# Patient Record
Sex: Female | Born: 1990 | Race: Black or African American | Hispanic: No | Marital: Single | Smoking: Never smoker
Health system: Southern US, Community
[De-identification: ages and names within clinical notes are randomized; demographics above are authoritative.]

## PROBLEM LIST (undated history)

## (undated) ENCOUNTER — Inpatient Hospital Stay (HOSPITAL_COMMUNITY): Payer: Self-pay

## (undated) DIAGNOSIS — K219 Gastro-esophageal reflux disease without esophagitis: Secondary | ICD-10-CM

## (undated) DIAGNOSIS — E739 Lactose intolerance, unspecified: Secondary | ICD-10-CM

## (undated) DIAGNOSIS — R519 Headache, unspecified: Secondary | ICD-10-CM

## (undated) DIAGNOSIS — N83209 Unspecified ovarian cyst, unspecified side: Secondary | ICD-10-CM

## (undated) HISTORY — PX: INDUCED ABORTION: SHX677

---

## 2013-08-24 ENCOUNTER — Encounter (HOSPITAL_COMMUNITY): Payer: Self-pay | Admitting: Emergency Medicine

## 2013-08-24 ENCOUNTER — Emergency Department (HOSPITAL_COMMUNITY)
Admission: EM | Admit: 2013-08-24 | Discharge: 2013-08-24 | Disposition: A | Discharge status: Home / Self Care | Payer: Medicaid Other | Attending: Emergency Medicine | Admitting: Emergency Medicine

## 2013-08-24 DIAGNOSIS — N949 Unspecified condition associated with female genital organs and menstrual cycle: Secondary | ICD-10-CM | POA: Diagnosis not present

## 2013-08-24 DIAGNOSIS — Z3202 Encounter for pregnancy test, result negative: Secondary | ICD-10-CM | POA: Insufficient documentation

## 2013-08-24 DIAGNOSIS — N938 Other specified abnormal uterine and vaginal bleeding: Secondary | ICD-10-CM | POA: Insufficient documentation

## 2013-08-24 DIAGNOSIS — IMO0002 Reserved for concepts with insufficient information to code with codable children: Secondary | ICD-10-CM | POA: Diagnosis not present

## 2013-08-24 DIAGNOSIS — N939 Abnormal uterine and vaginal bleeding, unspecified: Secondary | ICD-10-CM

## 2013-08-24 LAB — I-STAT CHEM 8, ED
BUN: 7 mg/dL (ref 6–23)
CHLORIDE: 103 meq/L (ref 96–112)
Calcium, Ion: 1.2 mmol/L (ref 1.12–1.23)
Creatinine, Ser: 1 mg/dL (ref 0.50–1.10)
GLUCOSE: 80 mg/dL (ref 70–99)
HCT: 42 % (ref 36.0–46.0)
Hemoglobin: 14.3 g/dL (ref 12.0–15.0)
POTASSIUM: 3.8 meq/L (ref 3.7–5.3)
SODIUM: 139 meq/L (ref 137–147)
TCO2: 25 mmol/L (ref 0–100)

## 2013-08-24 LAB — URINALYSIS, ROUTINE W REFLEX MICROSCOPIC
BILIRUBIN URINE: NEGATIVE
Glucose, UA: NEGATIVE mg/dL
HGB URINE DIPSTICK: NEGATIVE
KETONES UR: NEGATIVE mg/dL
Leukocytes, UA: NEGATIVE
Nitrite: NEGATIVE
PH: 5.5 (ref 5.0–8.0)
Protein, ur: NEGATIVE mg/dL
SPECIFIC GRAVITY, URINE: 1.023 (ref 1.005–1.030)
Urobilinogen, UA: 0.2 mg/dL (ref 0.0–1.0)

## 2013-08-24 LAB — WET PREP, GENITAL
CLUE CELLS WET PREP: NONE SEEN
TRICH WET PREP: NONE SEEN
WBC WET PREP: NONE SEEN
Yeast Wet Prep HPF POC: NONE SEEN

## 2013-08-24 LAB — PREGNANCY, URINE: Preg Test, Ur: NEGATIVE

## 2013-08-24 NOTE — ED Notes (Signed)
Pt placed in gown and in bed. Pt monitored by bp cuff, 12-lead, and pulse ox. 

## 2013-08-24 NOTE — Discharge Instructions (Signed)
°Emergency Department Resource Guide °1) Find a Doctor and Pay Out of Pocket °Although you won't have to find out who is covered by your insurance plan, it is a good idea to ask around and get recommendations. You will then need to call the office and see if the doctor you have chosen will accept you as a new patient and what types of options they offer for patients who are self-pay. Some doctors offer discounts or will set up payment plans for their patients who do not have insurance, but you will need to ask so you aren't surprised when you get to your appointment. ° °2) Contact Your Local Health Department °Not all health departments have doctors that can see patients for sick visits, but many do, so it is worth a call to see if yours does. If you don't know where your local health department is, you can check in your phone book. The CDC also has a tool to help you locate your state's health department, and many state websites also have listings of all of their local health departments. ° °3) Find a Walk-in Clinic °If your illness is not likely to be very severe or complicated, you may want to try a walk in clinic. These are popping up all over the country in pharmacies, drugstores, and shopping centers. They're usually staffed by nurse practitioners or physician assistants that have been trained to treat common illnesses and complaints. They're usually fairly quick and inexpensive. However, if you have serious medical issues or chronic medical problems, these are probably not your best option. ° °No Primary Care Doctor: °- Call [facility] at  832-8000 - they can help you locate a primary care doctor that  accepts your insurance, provides certain services, etc. °- Physician Referral Service- 1-800-533-3463 ° °Chronic Pain Problems: °Organization         Address  Phone   Notes  °[facility]  (336) 297-2271 Patients need to be referred by their primary care doctor.  ° °Medication  Assistance: °Organization         Address  Phone   Notes  °[place] [facility] [address] °[place], [place] [address] (336) 641-8030 --Must be a resident of [place] °-- Must have NO insurance coverage whatsoever (no Medicaid/ Medicare, etc.) °-- The pt. MUST have a primary care doctor that directs their care regularly and follows them in the community °  °[facility]  (866) 331-1348   °[employer]  (888) 892-1162   ° °Agencies that provide inexpensive medical care: °Organization         Address  Phone   Notes  °[facility]  (336) 832-8035   °[facility]    (336) 832-7272   °[facility] [address] °[place], [place] [address] (336) 832-4777   °[facility] [address], °[place] (336) 271-4999   °[facility]    (336) 373-0678   °[facility]    (336) 272-1050   °[facility] ° [address], [place] Phone:  (336) 832-4444, Fax:  (336) 832-4440 Hours of Operation:  9 am - 6 pm, M-F.  Also accepts Medicaid/Medicare and self-pay.  °[facility] ° [address], [place] Phone: (336) 832-3150, Fax: (336) [phone]. Hours of Operation:  8:30 am - 5:30 pm, M-F.  Also accepts Medicaid and self-pay.  °[facility] [place] [address], [place] Phone: (336) 878-6027   °[facility] [address], [place], [place] (336)723-1848, Ext. 123 Mondays & Thursdays: 7-9 AM.  First 15 patients are seen on a first come, first serve basis. °  ° °Medicaid-accepting [place] Providers: ° °Organization         Address  Phone   Notes  °[facility] [address], [place] (336) 641-2100 Also accepts self-pay patients.  °[facility] [address], [place] ° (336) 856-9996   °[facility] [address], [place]  (336) 288-8857   °[facility] [address] [place] [address], [place] (336) 299-7000   °Veita Bland [address], [place]  ° (336) 373-1557 Only accepts [place] Access Medicaid patients after they have their name applied to their card.  ° °Self-Pay (no insurance) in [place]: ° °Organization         Address  Phone   Notes  °Sickle Cell Patients, [facility] [address], [place] (336) 832-1970   °[facility] [address], [place] (336) 832-4400   °[facility] ° [address], [place] (336) 992-4800   °[facility]/Dr. Osei-Bonsu ° [address] [place] [address], [place] or [address], [place] (336) 841-8500 Phone number for both [place] and [place] locations is the same.  °[facility] [address], [place] (336) 299-0000   °[facility] [address] [place] [address], [place] or [address] (336) 852-7530 °(336) 878-2260   °[facility] [address], [place] (336) 350-1642, phone; (336) 294-5005, fax Sees patients 1st and 3rd Saturday of every month.  Must not qualify for public or private insurance (i.e. Medicaid, Medicare, [place] Health Choice, Veterans' Benefits) • Household income should be no more than 200% of the poverty level •The clinic cannot treat you if you are pregnant or think you are pregnant • Sexually transmitted diseases are not treated at the clinic.  ° ° °Dental Care: °Organization         Address  Phone  Notes  °[place] [facility] [address], [place] (336) 641-6152 Accepts children up to age 21 who are enrolled in Medicaid or [place] Health Choice; pregnant women with a Medicaid card; and children who have applied for Medicaid or [place] Health Choice, but were declined, whose parents can pay a reduced fee at time of service.  °[place]  [facility] [place]  [address], [place] (336) 641-7733 Accepts children up to age 21 who are enrolled in Medicaid or [place] Health Choice; pregnant women with a Medicaid card; and children who have applied for Medicaid or [place] Health Choice, but were declined, whose parents can pay a reduced fee at time of service.  °[facility] ° [address], [place] (336) 641-4533 Patients are seen by appointment only. Walk-ins are not accepted. [facility] will see patients 18 years of age and older. °Monday - Tuesday (8am-5pm) °Most Wednesdays (8:30-5pm) °$30 per visit, cash only  °[facility] ° [address], [place] (336) 641-4533 Patients are seen by appointment only. Walk-ins are not accepted. [facility] will see patients 18 years of age and older. °One   Wednesday Evening (Monthly: Volunteer Based).  $30 per visit, cash only  °[facility]  (919) 537-3737 for adults; Children under age 4, call Graduate Pediatric Dentistry at ([phone]) [phone]. Children aged 4-14, please call (919) [phone] to request a pediatric application. ° Dental services are provided in all areas of dental care including fillings, crowns and bridges, complete and partial dentures, implants, gum treatment, root canals, and extractions. Preventive care is also provided. Treatment is provided to both adults and children. °Patients are selected via a lottery and there is often a waiting list. °  °[facility] [address], °[place] ° (336) 763-8833 [email or web address] °  °[facility] [address], [place], [place] (336)723-1848, Ext. 123 Second and Fourth Thursday of each month, opens at 6:30 AM; Clinic ends at 9 AM.  Patients are seen on a first-come first-served basis, and a limited number are seen during each clinic.  ° °[facility] ° [address], [place], [place] (336) 723-7904    Eligibility Requirements °You must have lived in [place], [place], or [place] counties for at least the last three months. °  You cannot be eligible for state or federal sponsored healthcare insurance, including Veterans Administration, Medicaid, or Medicare. °  You generally cannot be eligible for healthcare insurance through your employer.  °  How to apply: °Eligibility screenings are held every Tuesday and Wednesday afternoon from 1:00 pm until 4:00 pm. You do not need an appointment for the interview!  °[facility] [address], [place], [place] 336-631-2330   °[facility]  336-342-8273   °[facility]  336-703-3100   °[facility]  336-570-6415   ° °Behavioral Health Resources in the Community: °Intensive Outpatient Programs °Organization         Address  Phone  Notes  °[facility] [address], [place], [place] 336-878-6098   °[facility] [address], [place], [place] 336-832-9800   °[facility] [address], [place], [place] ° 336-882-2125   °[facility] [address],  °[place], [place] 1-800-853-5163 or 336-641-4981   °Substance Abuse Resources °Organization         Address  Phone  Notes  °[facility]  336-882-2125   °[facility]  336-784-9470   °[facility]  336-285-9073   °[facility]  336-845-3988   °Residential & Outpatient Substance Abuse Program  1-800-659-3381   °Psychological Services °Organization         Address  Phone  Notes  °[facility]  336- 832-9600   °[facility]  336- 378-7881   °[facility] [address], [place] 1-800-853-5163 or 336-641-4981   ° °Mobile Crisis Teams °Organization         Address  Phone  Notes  °[facility], Mobile Crisis Care Unit  1-877-626-1772   °Assertive °[facility] ° [address]  [place], [place] 336-834-9664   °Sharon DeEsch [address] °[place] 336-554-5454   ° °Self-Help/Support Groups °Organization         Address  Phone             Notes  °[facility]  - variety of support groups  336- 373-1402 Call for more information  °Narcotics Anonymous (NA), [facility] [address], °[place]  2 meetings at this location  ° °  Residential Treatment Programs Organization         Address  Phone  Notes  [facility] [address],    [place]  [phone]-(213)[phone]   [facility]  [address], [place] [phone], [place], [place] [phone]   [facility] [address], [place] [phone] Admissions: 8am-3pm M-F  [facility] [address]    [place], [place] [phone]   [facility] [address], [place], [place] [phone]   [facility] [address]  [place], [place] [phone]   [facility] Intensive Outpatient [address], [place], [place] [phone]   [facility] ([facility] [address]  [place], [place] [phone] or [phone]   [facility] ([facility]) [address] [place], [place] [phone] Accepts Medicaid  [facility] [address]  [place] [phone] Substance Abuse/Addiction Treatment   [facility]         Address  Phone  Notes  [facility]  6132867623(888) ([phone]   Angie FavaJulie Brannon, PhD [address], [address] [place], [place]   ([phone](336) [phone] or [phone](336) [phone]   [facility]   [address] [place], [place] ([phone](336) [phone]   [facility] [address], [place], [place] [phone](336) [phone] Insurance/Medicaid/sponsorship through [facility]  [facility] [address]                                    [place], [place] [phone]) [phone] Therapy/tele-psych/case    [facility] [address]   [place], [place] [phone]) [phone]    Dr. Lolly MustacheArfeen  [phone](336) [phone]   [facility]  [employer] [facility] 1) [address], [place] 2) [address], [place] 3)  [address], [place] [phone](336) ([phone](336) [phone]  ([phone](336) [phone]   [facility] 5596800938(336) (516)483-9396 or [phone](336) [phone] (After Hours)       Take your usual prescriptions as previously directed.  Call your regular OB/GYN doctor tomorrow to schedule a follow up appointment within the next 2 days.  Return to the Emergency Department immediately sooner if worsening.

## 2013-08-24 NOTE — ED Provider Notes (Signed)
CSN: [id number]     Arrival date & time 08/24/13  1343 History   First MD Initiated Contact with Patient 08/24/13 1630     Chief Complaint  Patient presents with  . Vaginal Bleeding      HPI Pt was seen at 1655. Per pt, c/o gradual onset and persistence of waxing and waning vaginal bleeding for the past 3 weeks. Pt was evaluated by her OB/GYN Dr. Karleen HampshireSpencer for same, rx OCP on 08/10/13. Pt states she "took one" then "stopped because it make the blood turn from red to dark." Pt states she continues to have intermittent dark vaginal bleeding "with clots." States she has not called back her OB/GYN MD. Denies back pain, no N/V/D, no fevers, no vaginal discharge.    History reviewed. No pertinent past medical history.  History reviewed. No pertinent past surgical history.  History  Substance Use Topics  . Smoking status: Never Smoker   . Smokeless tobacco: Not on file  . Alcohol Use: No    Review of Systems ROS: Statement: All systems negative except as marked or noted in the HPI; Constitutional: Negative for fever and chills. ; ; Eyes: Negative for eye pain, redness and discharge. ; ; ENMT: Negative for ear pain, hoarseness, nasal congestion, sinus pressure and sore throat. ; ; Cardiovascular: Negative for chest pain, palpitations, diaphoresis, dyspnea and peripheral edema. ; ; Respiratory: Negative for cough, wheezing and stridor. ; ; Gastrointestinal: Negative for nausea, vomiting, diarrhea, abdominal pain, blood in stool, hematemesis, jaundice and rectal bleeding. . ; ; Genitourinary: Negative for dysuria, flank pain and hematuria. ; ; GYN:  +vaginal bleeding, no vaginal discharge, no vulvar pain. ;; Musculoskeletal: Negative for back pain and neck pain. Negative for swelling and trauma.; ; Skin: Negative for pruritus, rash, abrasions, blisters, bruising and skin lesion.; ; Neuro: Negative for headache, lightheadedness and neck stiffness. Negative for weakness, altered level of consciousness ,  altered mental status, extremity weakness, paresthesias, involuntary movement, seizure and syncope.      Allergies  Review of patient's allergies indicates no known allergies.  Home Medications   Prior to Admission medications   Medication Sig Start Date End Date Taking? Authorizing Provider  norgestimate-ethinyl estradiol (MONONESSA) 0.25-35 MG-MCG tablet Take 1 tablet by mouth daily.   Yes Historical Provider, MD   BP 115/66  Pulse 53  Temp(Src) 97.9 F (36.6 C) (Oral)  Resp 15  Ht 5\' 2"  (1.575 m)  Wt 177 lb (80.287 kg)  BMI 32.37 kg/m2  SpO2 100%  LMP 08/24/2013 Physical Exam 1700: Physical examination:  Nursing notes reviewed; Vital signs and O2 SAT reviewed;  Constitutional: Well developed, Well nourished, Well hydrated, In no acute distress; Head:  Normocephalic, atraumatic; Eyes: EOMI, PERRL, No scleral icterus; ENMT: Mouth and pharynx normal, Mucous membranes moist; Neck: Supple, Full range of motion, No lymphadenopathy; Cardiovascular: Regular rate and rhythm, No murmur, rub, or gallop; Respiratory: Breath sounds clear & equal bilaterally, No rales, rhonchi, wheezes.  Speaking full sentences with ease, Normal respiratory effort/excursion; Chest: Nontender, Movement normal; Abdomen: Soft, Nontender, Nondistended, Normal bowel sounds; Genitourinary: No CVA tenderness. Pelvic exam performed with permission of pt and female ED tech assist during exam.  External genitalia w/o lesions. Vaginal vault without discharge, +dark blood in vaginal vault.  Cervix w/o lesions, not friable, GC/chlam and wet prep obtained and sent to lab.  Bimanual exam w/o CMT, uterine or adnexal tenderness.;; Extremities: Pulses normal, No tenderness, No edema, No calf edema or asymmetry.; Neuro: AA&Ox3, Major CN grossly  intact.  Speech clear. No gross focal motor or sensory deficits in extremities. Climbs on and off stretcher easily by herself. Gait steady.; Skin: Color normal, Warm, Dry.   ED Course   Procedures     MDM  MDM Reviewed: nursing note and vitals Interpretation: labs    Results for orders placed during the hospital encounter of 08/24/13  WET PREP, GENITAL      Result Value Ref Range   Yeast Wet Prep HPF POC NONE SEEN  NONE SEEN   Trich, Wet Prep NONE SEEN  NONE SEEN   Clue Cells Wet Prep HPF POC NONE SEEN  NONE SEEN   WBC, Wet Prep HPF POC NONE SEEN  NONE SEEN  URINALYSIS, ROUTINE W REFLEX MICROSCOPIC      Result Value Ref Range   Color, Urine YELLOW  YELLOW   APPearance CLEAR  CLEAR   Specific Gravity, Urine 1.023  1.005 - 1.030   pH 5.5  5.0 - 8.0   Glucose, UA NEGATIVE  NEGATIVE mg/dL   Hgb urine dipstick NEGATIVE  NEGATIVE   Bilirubin Urine NEGATIVE  NEGATIVE   Ketones, ur NEGATIVE  NEGATIVE mg/dL   Protein, ur NEGATIVE  NEGATIVE mg/dL   Urobilinogen, UA 0.2  0.0 - 1.0 mg/dL   Nitrite NEGATIVE  NEGATIVE   Leukocytes, UA NEGATIVE  NEGATIVE  PREGNANCY, URINE      Result Value Ref Range   Preg Test, Ur NEGATIVE  NEGATIVE  I-STAT CHEM 8, ED      Result Value Ref Range   Sodium 139  137 - 147 mEq/L   Potassium 3.8  3.7 - 5.3 mEq/L   Chloride 103  96 - 112 mEq/L   BUN 7  6 - 23 mg/dL   Creatinine, Ser 9.60  0.50 - 1.10 mg/dL   Glucose, Bld 80  70 - 99 mg/dL   Calcium, Ion 4.54  1.12 - 1.23 mmol/L   TCO2 25  0 - 100 mmol/L   Hemoglobin 14.3  12.0 - 15.0 g/dL   HCT 09.8  11.9 - 14.7 %    1855:  VS, H/H stable. Workup reassuring. Pt encouraged to take the medications as rx by her OB/GYN MD; verb understanding. Dx and testing d/w pt.  Questions answered.  Verb understanding, agreeable to d/c home with outpt f/u.     Laray Anger, DO 08/25/13 1627

## 2013-08-24 NOTE — ED Notes (Addendum)
Pt here for irregular periods and passing clots. Soaking pad every 4 hours. Pt was checked for serum preg level on the 2nd and was not pregnant. Pt has GI MD appt tomm for reported pain and H pylori.

## 2013-08-25 LAB — GC/CHLAMYDIA PROBE AMP
CT Probe RNA: INVALID
GC PROBE AMP APTIMA: INVALID

## 2014-05-19 DIAGNOSIS — R768 Other specified abnormal immunological findings in serum: Secondary | ICD-10-CM | POA: Insufficient documentation

## 2014-06-01 ENCOUNTER — Other Ambulatory Visit: Payer: Self-pay | Admitting: Physician Assistant

## 2014-06-01 DIAGNOSIS — R1084 Generalized abdominal pain: Secondary | ICD-10-CM

## 2014-06-07 ENCOUNTER — Other Ambulatory Visit: Payer: Medicaid Other

## 2014-06-08 ENCOUNTER — Ambulatory Visit
Admission: RE | Admit: 2014-06-08 | Discharge: 2014-06-08 | Disposition: A | Discharge status: Home / Self Care | Payer: Medicaid Other | Source: Ambulatory Visit | Attending: Physician Assistant | Admitting: Physician Assistant

## 2014-06-08 DIAGNOSIS — R1084 Generalized abdominal pain: Secondary | ICD-10-CM

## 2015-06-13 ENCOUNTER — Encounter (HOSPITAL_COMMUNITY): Payer: Self-pay | Admitting: Emergency Medicine

## 2015-06-13 ENCOUNTER — Emergency Department (HOSPITAL_COMMUNITY)
Admission: EM | Admit: 2015-06-13 | Discharge: 2015-06-13 | Disposition: A | Discharge status: Home / Self Care | Payer: Medicaid Other | Attending: Emergency Medicine | Admitting: Emergency Medicine

## 2015-06-13 DIAGNOSIS — K219 Gastro-esophageal reflux disease without esophagitis: Secondary | ICD-10-CM

## 2015-06-13 DIAGNOSIS — Z79899 Other long term (current) drug therapy: Secondary | ICD-10-CM | POA: Insufficient documentation

## 2015-06-13 LAB — COMPREHENSIVE METABOLIC PANEL
ALT: 25 U/L (ref 14–54)
AST: 34 U/L (ref 15–41)
Albumin: 4.4 g/dL (ref 3.5–5.0)
Alkaline Phosphatase: 47 U/L (ref 38–126)
Anion gap: 10 (ref 5–15)
BILIRUBIN TOTAL: 0.6 mg/dL (ref 0.3–1.2)
BUN: 10 mg/dL (ref 6–20)
CO2: 25 mmol/L (ref 22–32)
CREATININE: 0.8 mg/dL (ref 0.44–1.00)
Calcium: 9.6 mg/dL (ref 8.9–10.3)
Chloride: 104 mmol/L (ref 101–111)
Glucose, Bld: 86 mg/dL (ref 65–99)
POTASSIUM: 3.7 mmol/L (ref 3.5–5.1)
Sodium: 139 mmol/L (ref 135–145)
TOTAL PROTEIN: 8.3 g/dL — AB (ref 6.5–8.1)

## 2015-06-13 LAB — URINALYSIS, ROUTINE W REFLEX MICROSCOPIC
Bilirubin Urine: NEGATIVE
Glucose, UA: NEGATIVE mg/dL
Hgb urine dipstick: NEGATIVE
Ketones, ur: NEGATIVE mg/dL
LEUKOCYTES UA: NEGATIVE
NITRITE: NEGATIVE
PROTEIN: NEGATIVE mg/dL
Specific Gravity, Urine: 1.02 (ref 1.005–1.030)
pH: 5.5 (ref 5.0–8.0)

## 2015-06-13 LAB — CBC
HEMATOCRIT: 40.7 % (ref 36.0–46.0)
Hemoglobin: 13.9 g/dL (ref 12.0–15.0)
MCH: 28.7 pg (ref 26.0–34.0)
MCHC: 34.2 g/dL (ref 30.0–36.0)
MCV: 84.1 fL (ref 78.0–100.0)
PLATELETS: 222 10*3/uL (ref 150–400)
RBC: 4.84 MIL/uL (ref 3.87–5.11)
RDW: 13 % (ref 11.5–15.5)
WBC: 5.5 10*3/uL (ref 4.0–10.5)

## 2015-06-13 LAB — LIPASE, BLOOD: Lipase: 23 U/L (ref 11–51)

## 2015-06-13 LAB — I-STAT BETA HCG BLOOD, ED (MC, WL, AP ONLY)

## 2015-06-13 MED ORDER — SUCRALFATE 1 G PO TABS
1.0000 g | ORAL_TABLET | Freq: Once | ORAL | Status: AC
  Administered 2015-06-13: 1 g via ORAL
  Filled 2015-06-13: qty 1

## 2015-06-13 MED ORDER — SUCRALFATE 1 G PO TABS: 1.0000 g | ORAL_TABLET | Freq: Three times a day (TID) | ORAL | Status: DC

## 2015-06-13 MED ORDER — GI COCKTAIL ~~LOC~~
30.0000 mL | Freq: Once | ORAL | Status: AC
  Administered 2015-06-13: 30 mL via ORAL
  Filled 2015-06-13: qty 30

## 2015-06-13 MED ORDER — FAMOTIDINE 20 MG PO TABS: 20.0000 mg | ORAL_TABLET | Freq: Two times a day (BID) | ORAL | Status: DC

## 2015-06-13 NOTE — Discharge Instructions (Signed)
Gastroesophageal Reflux Disease, Adult Normally, food travels down the esophagus and stays in the stomach to be digested. However, when a Teresa Bryant has gastroesophageal reflux disease (GERD), food and stomach acid move back up into the esophagus. When this happens, the esophagus becomes sore and inflamed. Over time, GERD can create small holes (ulcers) in the lining of the esophagus.  CAUSES This condition is caused by a problem with the muscle between the esophagus and the stomach (lower esophageal sphincter, or LES). Normally, the LES muscle closes after food passes through the esophagus to the stomach. When the LES is weakened or abnormal, it does not close properly, and that allows food and stomach acid to go back up into the esophagus. The LES can be weakened by certain dietary substances, medicines, and medical conditions, including:  Tobacco use.  Pregnancy.  Having a hiatal hernia.  Heavy alcohol use.  Certain foods and beverages, such as coffee, chocolate, onions, and peppermint. RISK FACTORS This condition is more likely to develop in:  People who have an increased body weight.  People who have connective tissue disorders.  People who use NSAID medicines. SYMPTOMS Symptoms of this condition include:  Heartburn.  Difficult or painful swallowing.  The feeling of having a lump in the throat.  Abitter taste in the mouth.  Bad breath.  Having a large amount of saliva.  Having an upset or bloated stomach.  Belching.  Chest pain.  Shortness of breath or wheezing.  Ongoing (chronic) cough or a night-time cough.  Wearing away of tooth enamel.  Weight loss. Different conditions can cause chest pain. Make sure to see your health care provider if you experience chest pain. DIAGNOSIS Your health care provider will take a medical history and perform a physical exam. To determine if you have mild or severe GERD, your health care provider may also monitor how you respond  to treatment. You may also have other tests, including:  An endoscopy toexamine your stomach and esophagus with a small camera.  A test thatmeasures the acidity level in your esophagus.  A test thatmeasures how much pressure is on your esophagus.  A barium swallow or modified barium swallow to show the shape, size, and functioning of your esophagus. TREATMENT The goal of treatment is to help relieve your symptoms and to prevent complications. Treatment for this condition may vary depending on how severe your symptoms are. Your health care provider may recommend:  Changes to your diet.  Medicine.  Surgery. HOME CARE INSTRUCTIONS Diet  Follow a diet as recommended by your health care provider. This may involve avoiding foods and drinks such as:  Coffee and tea (with or without caffeine).  Drinks that containalcohol.  Energy drinks and sports drinks.  Carbonated drinks or sodas.  Chocolate and cocoa.  Peppermint and mint flavorings.  Garlic and onions.  Horseradish.  Spicy and acidic foods, including peppers, chili powder, curry powder, vinegar, hot sauces, and barbecue sauce.  Citrus fruit juices and citrus fruits, such as oranges, lemons, and limes.  Tomato-based foods, such as red sauce, chili, salsa, and pizza with red sauce.  Fried and fatty foods, such as donuts, french fries, potato chips, and high-fat dressings.  High-fat meats, such as hot dogs and fatty cuts of red and white meats, such as rib eye steak, sausage, ham, and bacon.  High-fat dairy items, such as whole milk, butter, and cream cheese.  Eat small, frequent meals instead of large meals.  Avoid drinking large amounts of liquid with your   meals.  Avoid eating meals during the 2-3 hours before bedtime.  Avoid lying down right after you eat.  Do not exercise right after you eat. General Instructions  Pay attention to any changes in your symptoms.  Take over-the-counter and prescription  medicines only as told by your health care provider. Do not take aspirin, ibuprofen, or other NSAIDs unless your health care provider told you to do so.  Do not use any tobacco products, including cigarettes, chewing tobacco, and e-cigarettes. If you need help quitting, ask your health care provider.  Wear loose-fitting clothing. Do not wear anything tight around your waist that causes pressure on your abdomen.  Raise (elevate) the head of your bed 6 inches (15cm).  Try to reduce your stress, such as with yoga or meditation. If you need help reducing stress, ask your health care provider.  If you are overweight, reduce your weight to an amount that is healthy for you. Ask your health care provider for guidance about a safe weight loss goal.  Keep all follow-up visits as told by your health care provider. This is important. SEEK MEDICAL CARE IF:  You have new symptoms.  You have unexplained weight loss.  You have difficulty swallowing, or it hurts to swallow.  You have wheezing or a persistent cough.  Your symptoms do not improve with treatment.  You have a hoarse voice. SEEK IMMEDIATE MEDICAL CARE IF:  You have pain in your arms, neck, jaw, teeth, or back.  You feel sweaty, dizzy, or light-headed.  You have chest pain or shortness of breath.  You vomit and your vomit looks like blood or coffee grounds.  You faint.  Your stool is bloody or black.  You cannot swallow, drink, or eat.   This information is not intended to replace advice given to you by your health care provider. Make sure you discuss any questions you have with your health care provider.   Document Released: 11/06/2004 Document Revised: 10/18/2014 Document Reviewed: 05/24/2014 Elsevier Interactive Patient Education 2016 [employer]  

## 2015-06-13 NOTE — ED Provider Notes (Signed)
CSN: [id number]     Arrival date & time 06/13/15  1038 History   First MD Initiated Contact with Patient 06/13/15 1134     Chief Complaint  Patient presents with  . Abdominal Pain     (Consider location/radiation/quality/duration/timing/severity/associated sxs/prior Treatment) HPI Comments: Similar sx last year and seen by GI and had an egd which was neg for pud No vaginal bleeding or d/c  Patient is a 25 y.o. female presenting with abdominal pain. The history is provided by the patient.  Abdominal Pain Pain location:  Generalized Pain quality: burning   Pain radiates to:  Does not radiate Pain severity:  No pain Onset quality:  Sudden Duration:  1 week Timing:  Intermittent Progression:  Waxing and waning Chronicity:  Recurrent Worsened by:  Nothing tried Ineffective treatments:  None tried Associated symptoms: no diarrhea, no fever, no nausea and no vomiting     History reviewed. No pertinent past medical history. History reviewed. No pertinent past surgical history. No family history on file. Social History  Substance Use Topics  . Smoking status: Never Smoker   . Smokeless tobacco: None  . Alcohol Use: No   OB History    No data available     Review of Systems  Constitutional: Negative for fever.  Gastrointestinal: Positive for abdominal pain. Negative for nausea, vomiting and diarrhea.  All other systems reviewed and are negative.     Allergies  Review of patient's allergies indicates no known allergies.  Home Medications   Prior to Admission medications   Medication Sig Start Date End Date Taking? Authorizing Provider  acetaminophen (TYLENOL) 500 MG tablet Take 500 mg by mouth every 6 (six) hours as needed for moderate pain or headache.   Yes Historical Provider, MD  omeprazole (PRILOSEC) 20 MG capsule Take 20 mg by mouth daily as needed (acid reflux).  12/13/12  Yes Historical Provider, MD  valACYclovir (VALTREX) 1000 MG tablet Take 500 mg by mouth 2  (two) times daily as needed (out breaks). For 3 days 05/19/14  Yes Historical Provider, MD   BP 114/67 mmHg  Pulse 59  Temp(Src) 97.8 F (36.6 C) (Oral)  Resp 18  Ht 5\' 2"  (1.575 m)  Wt 74.844 kg  BMI 30.17 kg/m2  SpO2 99% Physical Exam  Constitutional: She is oriented to [place], place, and time. She appears well-developed and well-nourished.  Non-toxic appearance. No distress.  HENT:  Head: Normocephalic and atraumatic.  Eyes: Conjunctivae, EOM and lids are normal. Pupils are equal, round, and reactive to light.  Neck: Normal range of motion. Neck supple. No tracheal deviation present. No thyroid mass present.  Cardiovascular: Normal rate, regular rhythm and normal heart sounds.  Exam reveals no gallop.   No murmur heard. Pulmonary/Chest: Effort normal and breath sounds normal. No stridor. No respiratory distress. She has no decreased breath sounds. She has no wheezes. She has no rhonchi. She has no rales.  Abdominal: Soft. Normal appearance and bowel sounds are normal. She exhibits no distension. There is no tenderness. There is no rebound and no CVA tenderness.  Musculoskeletal: Normal range of motion. She exhibits no edema or tenderness.  Neurological: She is alert and oriented to [place], place, and time. She has normal strength. No cranial nerve deficit or sensory deficit. GCS eye subscore is 4. GCS verbal subscore is 5. GCS motor subscore is 6.  Skin: Skin is warm and dry. No abrasion and no rash noted.  Psychiatric: She has a normal mood and affect. Her speech  is normal and behavior is normal.  Nursing note and vitals reviewed.   ED Course  Procedures (including critical care time) Labs Review Labs Reviewed  LIPASE, BLOOD  COMPREHENSIVE METABOLIC PANEL  CBC  URINALYSIS, ROUTINE W REFLEX MICROSCOPIC (NOT AT [facility])  I-STAT BETA HCG BLOOD, ED ([facility], [facility], [facility] ONLY)    Imaging Review No results found. I have personally reviewed and evaluated these images and lab results as part of  my medical decision-making.   EKG Interpretation None      MDM   Final diagnoses:  None    Patient given meds for reflux and feels better. Repeat abdominal exam at time of discharge remained stable    Lorre Nick, MD 06/13/15 [phone]

## 2015-06-13 NOTE — ED Notes (Signed)
Patient here with complaints of lower abd pain x1 week. Nausea. Denies urinary symptoms.

## 2016-02-20 ENCOUNTER — Emergency Department (HOSPITAL_COMMUNITY): Payer: 59

## 2016-02-20 ENCOUNTER — Encounter (HOSPITAL_COMMUNITY): Payer: Self-pay

## 2016-02-20 ENCOUNTER — Emergency Department (HOSPITAL_COMMUNITY)
Admission: EM | Admit: 2016-02-20 | Discharge: 2016-02-20 | Disposition: A | Discharge status: Home / Self Care | Payer: 59 | Attending: Emergency Medicine | Admitting: Emergency Medicine

## 2016-02-20 DIAGNOSIS — B9689 Other specified bacterial agents as the cause of diseases classified elsewhere: Secondary | ICD-10-CM | POA: Diagnosis not present

## 2016-02-20 DIAGNOSIS — R102 Pelvic and perineal pain: Secondary | ICD-10-CM

## 2016-02-20 DIAGNOSIS — N760 Acute vaginitis: Secondary | ICD-10-CM | POA: Insufficient documentation

## 2016-02-20 DIAGNOSIS — N898 Other specified noninflammatory disorders of vagina: Secondary | ICD-10-CM | POA: Diagnosis not present

## 2016-02-20 HISTORY — DX: Lactose intolerance, unspecified: E73.9

## 2016-02-20 HISTORY — DX: Gastro-esophageal reflux disease without esophagitis: K21.9

## 2016-02-20 LAB — I-STAT BETA HCG BLOOD, ED (MC, WL, AP ONLY): I-stat hCG, quantitative: <5 m[IU]/mL (ref <5)

## 2016-02-20 LAB — WET PREP, GENITAL
Sperm: NONE SEEN
TRICH WET PREP: NONE SEEN
YEAST WET PREP: NONE SEEN

## 2016-02-20 LAB — CBC
HEMATOCRIT: 40.8 % (ref 36.0–46.0)
Hemoglobin: 13.4 g/dL (ref 12.0–15.0)
MCH: 28 pg (ref 26.0–34.0)
MCHC: 32.8 g/dL (ref 30.0–36.0)
MCV: 85.4 fL (ref 78.0–100.0)
Platelets: 242 10*3/uL (ref 150–400)
RBC: 4.78 MIL/uL (ref 3.87–5.11)
RDW: 12.9 % (ref 11.5–15.5)
WBC: 5.4 10*3/uL (ref 4.0–10.5)

## 2016-02-20 LAB — URINALYSIS, ROUTINE W REFLEX MICROSCOPIC
BILIRUBIN URINE: NEGATIVE
Bacteria, UA: NONE SEEN
Glucose, UA: NEGATIVE mg/dL
KETONES UR: NEGATIVE mg/dL
LEUKOCYTES UA: NEGATIVE
NITRITE: NEGATIVE
PH: 5 (ref 5.0–8.0)
Protein, ur: NEGATIVE mg/dL
Specific Gravity, Urine: 1.012 (ref 1.005–1.030)

## 2016-02-20 LAB — COMPREHENSIVE METABOLIC PANEL
ALT: 23 U/L (ref 14–54)
AST: 29 U/L (ref 15–41)
Albumin: 3.9 g/dL (ref 3.5–5.0)
Alkaline Phosphatase: 46 U/L (ref 38–126)
Anion gap: 6 (ref 5–15)
BUN: 9 mg/dL (ref 6–20)
CO2: 26 mmol/L (ref 22–32)
Calcium: 9.1 mg/dL (ref 8.9–10.3)
Chloride: 106 mmol/L (ref 101–111)
Creatinine, Ser: 0.87 mg/dL (ref 0.44–1.00)
Glucose, Bld: 94 mg/dL (ref 65–99)
POTASSIUM: 3.5 mmol/L (ref 3.5–5.1)
Sodium: 138 mmol/L (ref 135–145)
Total Bilirubin: 0.5 mg/dL (ref 0.3–1.2)
Total Protein: 7.4 g/dL (ref 6.5–8.1)

## 2016-02-20 LAB — LIPASE, BLOOD: Lipase: 23 U/L (ref 11–51)

## 2016-02-20 MED ORDER — AZITHROMYCIN 250 MG PO TABS
1000.0000 mg | ORAL_TABLET | Freq: Once | ORAL | Status: AC
  Administered 2016-02-20: 1000 mg via ORAL
  Filled 2016-02-20: qty 4

## 2016-02-20 MED ORDER — METRONIDAZOLE 500 MG PO TABS: 500.0000 mg | ORAL_TABLET | Freq: Two times a day (BID) | ORAL | 0 refills | Status: DC

## 2016-02-20 MED ORDER — CEFTRIAXONE SODIUM 250 MG IJ SOLR
250.0000 mg | Freq: Once | INTRAMUSCULAR | Status: AC
  Administered 2016-02-20: 250 mg via INTRAMUSCULAR
  Filled 2016-02-20: qty 250

## 2016-02-20 NOTE — ED Triage Notes (Signed)
Pt with mid low abdominal pain with vaginal discharge.  Has had similar symptoms off/on including the discharge since abortion last August.  States nausea with no vomiting.  No fever.  Discharge clear to yellow.  Blood spotting now.  LMP 12/27

## 2016-02-20 NOTE — ED Provider Notes (Signed)
[facility]-EMERGENCY DEPT Provider Note   CSN: [id number] Arrival date & time: 02/20/16  [id number]     History   Chief Complaint Chief Complaint  Patient presents with  . Abdominal Pain  . Vaginal Discharge    HPI Teresa Bryant is a 26 y.o. female.  Patient presents to the emergency department with chief complaint of right lower abdominal pain and clear to yellow vaginal discharge. She states that she has had intermittent symptoms for the past several months. She denies any fevers, chills, nausea, vomiting, or diarrhea. She denies any modifying factors. She states that her last menstrual period was 02/06/16. She has not taken anything for her symptoms. She is sexually active.   The history is provided by the patient. No language interpreter was used.    Past Medical History:  Diagnosis Date  . GERD (gastroesophageal reflux disease)   . Lactose intolerance     There are no active problems to display for this patient.   Past Surgical History:  Procedure Laterality Date  . INDUCED ABORTION      OB History    No data available       Home Medications    Prior to Admission medications   Medication Sig Start Date End Date Taking? Authorizing Provider  acetaminophen (TYLENOL) 500 MG tablet Take 500 mg by mouth every 6 (six) hours as needed for moderate pain or headache.   Yes Historical Provider, MD  Miconazole Nitrate (MONISTAT 7 VA) Place 1 application vaginally at bedtime. For 7 days   Yes Historical Provider, MD  valACYclovir (VALTREX) 1000 MG tablet Take 500 mg by mouth 2 (two) times daily as needed (out breaks). For 3 days 05/19/14  Yes Historical Provider, MD    Family History History reviewed. No pertinent family history.  Social History Social History  Substance Use Topics  . Smoking status: Never Smoker  . Smokeless tobacco: Never Used  . Alcohol use No     Allergies   Patient has no known allergies.   Review of Systems Review of Systems  All other systems  reviewed and are negative.    Physical Exam Updated Vital Signs BP 117/78   Pulse (!) 48   Temp 98.5 F (36.9 C) (Oral)   Resp 18   Ht 5\' 2"  (1.575 m)   Wt 76.2 kg   LMP 02/06/2016   SpO2 100%   BMI 30.73 kg/m   Physical Exam  Constitutional: She is oriented to [place], place, and time. She appears well-developed and well-nourished.  HENT:  Head: Normocephalic and atraumatic.  Eyes: Conjunctivae and EOM are normal. Pupils are equal, round, and reactive to light.  Neck: Normal range of motion. Neck supple.  Cardiovascular: Normal rate and regular rhythm.  Exam reveals no gallop and no friction rub.   No murmur heard. Pulmonary/Chest: Effort normal and breath sounds normal. No respiratory distress. She has no wheezes. She has no rales. She exhibits no tenderness.  Abdominal: Soft. Bowel sounds are normal. She exhibits no distension and no mass. There is no tenderness. There is no rebound and no guarding.  Genitourinary:  Genitourinary Comments: Pelvic exam chaperoned by female ER tech, mild right adnexal tenderness, no left adnexal tenderness, no uterine tenderness, mild white vaginal discharge, no bleeding, no CMT or friability, no foreign body, no injury to the external genitalia, no other significant findings   Musculoskeletal: Normal range of motion. She exhibits no edema or tenderness.  Neurological: She is alert and oriented to [place], place, and  time.  Skin: Skin is warm and dry.  Psychiatric: She has a normal mood and affect. Her behavior is normal. Judgment and thought content normal.  Nursing note and vitals reviewed.    ED Treatments / Results  Labs (all labs ordered are listed, but only abnormal results are displayed) Labs Reviewed  WET PREP, GENITAL - Abnormal; Notable for the following:       Result Value   Clue Cells Wet Prep HPF POC PRESENT (*)    WBC, Wet Prep HPF POC MANY (*)    All other components within normal limits  URINALYSIS, ROUTINE W REFLEX  MICROSCOPIC - Abnormal; Notable for the following:    Hgb urine dipstick SMALL (*)    Squamous Epithelial / LPF 0-5 (*)    All other components within normal limits  LIPASE, BLOOD  COMPREHENSIVE METABOLIC PANEL  CBC  I-STAT BETA HCG BLOOD, ED ([facility], [facility], [facility] ONLY)  GC/CHLAMYDIA PROBE AMP ([place]) NOT AT  [facility]    EKG  EKG Interpretation None       Radiology US Transvaginal Non-ob  Result Date: 02/20/2016 CLINICAL DATA:  Intermittent pelvic pain since abortion in August 2017 EXAM: TRANSABDOMINAL AND TRANSVAGINAL ULTRASOUND OF PELVIS DOPPLER ULTRASOUND OF OVARIES TECHNIQUE: Both transabdominal and transvaginal ultrasound examinations of the pelvis were performed. Transabdominal technique was performed for global imaging of the pelvis including uterus, ovaries, adnexal regions, and pelvic cul-de-sac. It was necessary to proceed with endovaginal exam following the transabdominal exam to visualize the ovaries. Color and duplex Doppler ultrasound was utilized to evaluate blood flow to the ovaries. COMPARISON:  None. FINDINGS: Uterus Measurements: 7.4 x 3.9 by 4.5 cm. No fibroids or other mass visualized. Endometrium Thickness: 9 mm thickness within normal limits. No focal abnormality visualized. Right ovary Measurements: 2.8 x 1.9 x 2.2 cm. Normal appearance/no adnexal mass. Left ovary Measurements: 2.9 x 1.9 x 2.1 cm. Normal appearance/no adnexal mass. Pulsed Doppler evaluation of both ovaries demonstrates normal low-resistance arterial and venous waveforms. Other findings No abnormal free fluid. IMPRESSION: 1. Normal pelvic ultrasound. No adnexal mass. No evidence of ovarian torsion. No pelvic free fluid. Electronically Signed   By: Natasha Mead M.D.   On: 02/20/2016 13:39   US Pelvis Complete  Result Date: 02/20/2016 CLINICAL DATA:  Intermittent pelvic pain since abortion in August 2017 EXAM: TRANSABDOMINAL AND TRANSVAGINAL ULTRASOUND OF PELVIS DOPPLER ULTRASOUND OF OVARIES TECHNIQUE: Both  transabdominal and transvaginal ultrasound examinations of the pelvis were performed. Transabdominal technique was performed for global imaging of the pelvis including uterus, ovaries, adnexal regions, and pelvic cul-de-sac. It was necessary to proceed with endovaginal exam following the transabdominal exam to visualize the ovaries. Color and duplex Doppler ultrasound was utilized to evaluate blood flow to the ovaries. COMPARISON:  None. FINDINGS: Uterus Measurements: 7.4 x 3.9 by 4.5 cm. No fibroids or other mass visualized. Endometrium Thickness: 9 mm thickness within normal limits. No focal abnormality visualized. Right ovary Measurements: 2.8 x 1.9 x 2.2 cm. Normal appearance/no adnexal mass. Left ovary Measurements: 2.9 x 1.9 x 2.1 cm. Normal appearance/no adnexal mass. Pulsed Doppler evaluation of both ovaries demonstrates normal low-resistance arterial and venous waveforms. Other findings No abnormal free fluid. IMPRESSION: 1. Normal pelvic ultrasound. No adnexal mass. No evidence of ovarian torsion. No pelvic free fluid. Electronically Signed   By: Natasha Mead M.D.   On: 02/20/2016 13:39   [place] Art/ven Flow Abd Pelv Doppler  Result Date: 02/20/2016 CLINICAL DATA:  Intermittent pelvic pain since abortion in August 2017 EXAM:  TRANSABDOMINAL AND TRANSVAGINAL ULTRASOUND OF PELVIS DOPPLER ULTRASOUND OF OVARIES TECHNIQUE: Both transabdominal and transvaginal ultrasound examinations of the pelvis were performed. Transabdominal technique was performed for global imaging of the pelvis including uterus, ovaries, adnexal regions, and pelvic cul-de-sac. It was necessary to proceed with endovaginal exam following the transabdominal exam to visualize the ovaries. Color and duplex Doppler ultrasound was utilized to evaluate blood flow to the ovaries. COMPARISON:  None. FINDINGS: Uterus Measurements: 7.4 x 3.9 by 4.5 cm. No fibroids or other mass visualized. Endometrium Thickness: 9 mm thickness within normal limits. No  focal abnormality visualized. Right ovary Measurements: 2.8 x 1.9 x 2.2 cm. Normal appearance/no adnexal mass. Left ovary Measurements: 2.9 x 1.9 x 2.1 cm. Normal appearance/no adnexal mass. Pulsed Doppler evaluation of both ovaries demonstrates normal low-resistance arterial and venous waveforms. Other findings No abnormal free fluid. IMPRESSION: 1. Normal pelvic ultrasound. No adnexal mass. No evidence of ovarian torsion. No pelvic free fluid. Electronically Signed   By: Natasha Mead M.D.   On: 02/20/2016 13:39    Procedures Procedures (including critical care time)  Medications Ordered in ED Medications  cefTRIAXone (ROCEPHIN) injection 250 mg (not administered)  azithromycin (ZITHROMAX) tablet 1,000 mg (not administered)     Initial Impression / Assessment and Plan / ED Course  I have reviewed the triage vital signs and the nursing notes.  Pertinent labs & imaging results that were available during my care of the patient were reviewed by me and considered in my medical decision making (see chart for details).  Clinical Course     Patient with vaginal discharge and mild right adnexal tenderness. Pelvic exam remarkable for mild tenderness, mild discharge. Clue cells seen on wet prep. GC pending. Will treat with Rocephin and azithromycin. Will also give Flagyl for bacterial vaginosis. Pelvic ultrasound is unremarkable. Patient is well-appearing. She is not in apparent distress. Discharged home.  Final Clinical Impressions(s) / ED Diagnoses   Final diagnoses:  Bacterial vaginosis  Vaginal discharge    New Prescriptions New Prescriptions   METRONIDAZOLE (FLAGYL) 500 MG TABLET    Take 1 tablet (500 mg total) by mouth 2 (two) times daily.        Roxy Horseman, PA-C 02/20/16 1407    Laurence Spates, MD 02/20/16 [phone]

## 2016-02-21 LAB — GC/CHLAMYDIA PROBE AMP (~~LOC~~) NOT AT ARMC
Chlamydia: NEGATIVE
NEISSERIA GONORRHEA: NEGATIVE

## 2016-06-29 DIAGNOSIS — R350 Frequency of micturition: Secondary | ICD-10-CM | POA: Diagnosis not present

## 2016-06-29 DIAGNOSIS — B373 Candidiasis of vulva and vagina: Secondary | ICD-10-CM | POA: Diagnosis not present

## 2016-07-04 DIAGNOSIS — B373 Candidiasis of vulva and vagina: Secondary | ICD-10-CM | POA: Diagnosis not present

## 2019-10-29 ENCOUNTER — Inpatient Hospital Stay (HOSPITAL_COMMUNITY): Payer: Medicaid Other

## 2019-10-29 ENCOUNTER — Encounter (HOSPITAL_COMMUNITY): Payer: Self-pay | Admitting: *Deleted

## 2019-10-29 ENCOUNTER — Other Ambulatory Visit: Payer: Self-pay

## 2019-10-29 ENCOUNTER — Inpatient Hospital Stay (HOSPITAL_COMMUNITY)
Admission: EM | Admit: 2019-10-29 | Discharge: 2019-10-29 | Disposition: A | Discharge status: Home / Self Care | Payer: Medicaid Other | Attending: Emergency Medicine | Admitting: Emergency Medicine

## 2019-10-29 DIAGNOSIS — O0001 Abdominal pregnancy with intrauterine pregnancy: Secondary | ICD-10-CM | POA: Diagnosis not present

## 2019-10-29 DIAGNOSIS — O210 Mild hyperemesis gravidarum: Secondary | ICD-10-CM | POA: Diagnosis not present

## 2019-10-29 DIAGNOSIS — O26899 Other specified pregnancy related conditions, unspecified trimester: Secondary | ICD-10-CM

## 2019-10-29 DIAGNOSIS — R109 Unspecified abdominal pain: Secondary | ICD-10-CM

## 2019-10-29 DIAGNOSIS — Z3490 Encounter for supervision of normal pregnancy, unspecified, unspecified trimester: Secondary | ICD-10-CM | POA: Diagnosis not present

## 2019-10-29 DIAGNOSIS — O26811 Pregnancy related exhaustion and fatigue, first trimester: Secondary | ICD-10-CM | POA: Insufficient documentation

## 2019-10-29 DIAGNOSIS — O219 Vomiting of pregnancy, unspecified: Secondary | ICD-10-CM

## 2019-10-29 DIAGNOSIS — O26891 Other specified pregnancy related conditions, first trimester: Secondary | ICD-10-CM | POA: Diagnosis not present

## 2019-10-29 DIAGNOSIS — Z20822 Contact with and (suspected) exposure to covid-19: Secondary | ICD-10-CM | POA: Insufficient documentation

## 2019-10-29 DIAGNOSIS — Z3201 Encounter for pregnancy test, result positive: Secondary | ICD-10-CM | POA: Diagnosis not present

## 2019-10-29 DIAGNOSIS — Z3A01 Less than 8 weeks gestation of pregnancy: Secondary | ICD-10-CM | POA: Insufficient documentation

## 2019-10-29 LAB — CBC
HCT: 40.5 % (ref 36.0–46.0)
Hemoglobin: 13.1 g/dL (ref 12.0–15.0)
MCH: 28.7 pg (ref 26.0–34.0)
MCHC: 32.3 g/dL (ref 30.0–36.0)
MCV: 88.6 fL (ref 80.0–100.0)
Platelets: 274 10*3/uL (ref 150–400)
RBC: 4.57 MIL/uL (ref 3.87–5.11)
RDW: 14 % (ref 11.5–15.5)
WBC: 9.4 10*3/uL (ref 4.0–10.5)
nRBC: 0 % (ref 0.0–0.2)

## 2019-10-29 LAB — POC URINE PREG, ED: Preg Test, Ur: POSITIVE — AB

## 2019-10-29 LAB — URINALYSIS, ROUTINE W REFLEX MICROSCOPIC
Bilirubin Urine: NEGATIVE
Glucose, UA: NEGATIVE mg/dL
Hgb urine dipstick: NEGATIVE
Ketones, ur: NEGATIVE mg/dL
Leukocytes,Ua: NEGATIVE
Nitrite: NEGATIVE
Protein, ur: NEGATIVE mg/dL
Specific Gravity, Urine: 1.013 (ref 1.005–1.030)
pH: 6 (ref 5.0–8.0)

## 2019-10-29 LAB — ABO/RH: ABO/RH(D): O POS

## 2019-10-29 LAB — SARS CORONAVIRUS 2 BY RT PCR (HOSPITAL ORDER, PERFORMED IN ~~LOC~~ HOSPITAL LAB): SARS Coronavirus 2: NEGATIVE

## 2019-10-29 LAB — HCG, QUANTITATIVE, PREGNANCY: hCG, Beta Chain, Quant, S: 129452 m[IU]/mL — ABNORMAL HIGH (ref <5)

## 2019-10-29 MED ORDER — PROMETHAZINE HCL 12.5 MG PO TABS: 12.5000 mg | ORAL_TABLET | Freq: Four times a day (QID) | ORAL | 0 refills | Status: DC | PRN

## 2019-10-29 NOTE — ED Provider Notes (Signed)
MSE was initiated and I personally evaluated the patient and placed orders (if any) at  12:40 AM on October 29, 2019.  The patient appears stable so that the remainder of the MSE may be completed by another provider.  Patient is a 29 yo female who has had at home positive pregnancy tests (estimated [redacted] weeks pregnant) who presents with fatigue, nausea, & intermittent lightheadedness with position changes over past couple of days. Denies pelvic pain, vaginal bleeding, syncope, chest pain, or dyspnea. Works with covid specimens.   On exam patient is nontoxic, vitals WNL. Heart RRR, lungs CTA.  Blood pressure 124/84, pulse 77, temperature 99.3 F (37.4 C), temperature source Oral, resp. rate 18, last menstrual period 09/09/2019, SpO2 100 %.  00:42: CONSULT: Discussed with Dr. Despina Hidden- accepts patient in transfer to [facility] for further evaluation/management.    Cherly Anderson, PA-C 10/29/19 0047    Gilda Crease, MD 10/29/19 [phone]

## 2019-10-29 NOTE — Discharge Instructions (Signed)
First Trimester of Pregnancy  The first trimester of pregnancy is from week 1 until the end of week 13 (months 1 through 3). During this time, your baby will begin to develop inside you. At 6-8 weeks, the eyes and face are formed, and the heartbeat can be seen on ultrasound. At the end of 12 weeks, all the baby's organs are formed. Prenatal care is all the medical care you receive before the birth of your baby. Make sure you get good prenatal care and follow all of your doctor's instructions. Follow these instructions at home: Medicines  Take over-the-counter and prescription medicines only as told by your doctor. Some medicines are safe and some medicines are not safe during pregnancy.  Take a prenatal vitamin that contains at least 600 micrograms (mcg) of folic acid.  If you have trouble pooping (constipation), take medicine that will make your stool soft (stool softener) if your doctor approves. Eating and drinking   Eat regular, healthy meals.  Your doctor will tell you the amount of weight gain that is right for you.  Avoid raw meat and uncooked cheese.  If you feel sick to your stomach (nauseous) or throw up (vomit): ? Eat 4 or 5 small meals a day instead of 3 large meals. ? Try eating a few soda crackers. ? Drink liquids between meals instead of during meals.  To prevent constipation: ? Eat foods that are high in fiber, like fresh fruits and vegetables, whole grains, and beans. ? Drink enough fluids to keep your pee (urine) clear or pale yellow. Activity  Exercise only as told by your doctor. Stop exercising if you have cramps or pain in your lower belly (abdomen) or low back.  Do not exercise if it is too hot, too humid, or if you are in a place of great height (high altitude).  Try to avoid standing for long periods of time. Move your legs often if you must stand in one place for a long time.  Avoid heavy lifting.  Wear low-heeled shoes. Sit and stand up  straight.  You can have sex unless your doctor tells you not to. Relieving pain and discomfort  Wear a good support bra if your breasts are sore.  Take warm water baths (sitz baths) to soothe pain or discomfort caused by hemorrhoids. Use hemorrhoid cream if your doctor says it is okay.  Rest with your legs raised if you have leg cramps or low back pain.  If you have puffy, bulging veins (varicose veins) in your legs: ? Wear support hose or compression stockings as told by your doctor. ? Raise (elevate) your feet for 15 minutes, 3-4 times a day. ? Limit salt in your food. Prenatal care  Schedule your prenatal visits by the twelfth week of pregnancy.  Write down your questions. Take them to your prenatal visits.  Keep all your prenatal visits as told by your doctor. This is important. Safety  Wear your seat belt at all times when driving.  Make a list of emergency phone numbers. The list should include numbers for family, friends, the hospital, and police and fire departments. General instructions  Ask your doctor for a referral to a local prenatal class. Begin classes no later than at the start of month 6 of your pregnancy.  Ask for help if you need counseling or if you need help with nutrition. Your doctor can give you advice or tell you where to go for help.  Do not use hot tubs, steam   rooms, or saunas.  Do not douche or use tampons or scented sanitary pads.  Do not cross your legs for long periods of time.  Avoid all herbs and alcohol. Avoid drugs that are not approved by your doctor.  Do not use any tobacco products, including cigarettes, chewing tobacco, and electronic cigarettes. If you need help quitting, ask your doctor. You may get counseling or other support to help you quit.  Avoid cat litter boxes and soil used by cats. These carry germs that can cause birth defects in the baby and can cause a loss of your baby (miscarriage) or stillbirth.  Visit your dentist.  At home, brush your teeth with a soft toothbrush. Be gentle when you floss. Contact a doctor if:  You are dizzy.  You have mild cramps or pressure in your lower belly.  You have a nagging pain in your belly area.  You continue to feel sick to your stomach, you throw up, or you have watery poop (diarrhea).  You have a bad smelling fluid coming from your vagina.  You have pain when you pee (urinate).  You have increased puffiness (swelling) in your face, hands, legs, or ankles. Get help right away if:  You have a fever.  You are leaking fluid from your vagina.  You have spotting or bleeding from your vagina.  You have very bad belly cramping or pain.  You gain or lose weight rapidly.  You throw up blood. It may look like coffee grounds.  You are around people who have German measles, fifth disease, or chickenpox.  You have a very bad headache.  You have shortness of breath.  You have any kind of trauma, such as from a fall or a car accident. Summary  The first trimester of pregnancy is from week 1 until the end of week 13 (months 1 through 3).  To take care of yourself and your unborn baby, you will need to eat healthy meals, take medicines only if your doctor tells you to do so, and do activities that are safe for you and your baby.  Keep all follow-up visits as told by your doctor. This is important as your doctor will have to ensure that your baby is healthy and growing well. This information is not intended to replace advice given to you by your health care provider. Make sure you discuss any questions you have with your health care provider. Document Revised: 05/20/2018 Document Reviewed: 02/05/2016 Elsevier Patient Education  2020 Elsevier Inc.  

## 2019-10-29 NOTE — MAU Note (Signed)
Pt sent over from ED with positive preg test.pt reports dizziness, fatigue since today. Also reports a "lite headache". Feels like she needs to be tested for COVID because she works at [employer]

## 2019-10-29 NOTE — MAU Provider Note (Signed)
History     CSN: [id number]  Arrival date and time: 10/29/19 6295   First Provider Initiated Contact with Patient 10/29/19 0232      Chief Complaint  Patient presents with  . Fatigue   HPI Teresa Bryant is a 29 y.o. [id number] at [redacted]w[redacted]d who presents to MAU from [facility] for evaluation of COVID symptoms including fatigue.  In the course of triage she was determined to have experienced sharp unilateral abdominal pain with no previous surveillance this pregnancy.  Patient works third shift and states she has been feeling more exhausted then normal. She states her daughter had to assist with her ADLs due to extreme fatigue. Her exhaustion started today.  She denies vaginal bleeding, dysuria, fever or recent illness.  OB History    Gravida  7   Para  1   Term  1   Preterm      AB  5   Living  1     SAB      TAB  5   Ectopic      Multiple      Live Births  1           Past Medical History:  Diagnosis Date  . GERD (gastroesophageal reflux disease)   . Lactose intolerance     Past Surgical History:  Procedure Laterality Date  . INDUCED ABORTION      History reviewed. No pertinent family history.  Social History   Tobacco Use  . Smoking status: Never Smoker  . Smokeless tobacco: Never Used  Substance Use Topics  . Alcohol use: No  . Drug use: No    Allergies: No Known Allergies  No medications prior to admission.    Review of Systems  Constitutional: Positive for fatigue.  Gastrointestinal: Positive for nausea.  All other systems reviewed and are negative.  Physical Exam   Blood pressure 114/66, pulse 65, temperature 98.6 F (37 C), temperature source Oral, resp. rate 18, height 5\' 2"  (1.575 m), weight 79.8 kg, last menstrual period 09/09/2019, SpO2 100 %.  Physical Exam Vitals and nursing note reviewed. Exam conducted with a chaperone present.  Constitutional:      Appearance: Normal appearance.  Cardiovascular:     Rate and Rhythm: Normal  rate.  Pulmonary:     Effort: Pulmonary effort is normal.     Breath sounds: Normal breath sounds.  Abdominal:     General: Abdomen is flat. Bowel sounds are normal.     Tenderness: There is no right CVA tenderness or left CVA tenderness.  Skin:    Capillary Refill: Capillary refill takes less than 2 seconds.  Neurological:     General: No focal deficit present.     Mental Status: She is alert.  Psychiatric:        Mood and Affect: Mood normal.     MAU Course  Procedures  Patient Vitals for the past 24 hrs:  BP Temp Temp src Pulse Resp SpO2 Height Weight  10/29/19 0330 114/66 -- -- 65 18 100 % -- --  10/29/19 0204 114/80 -- -- 70 -- -- -- --  10/29/19 0132 -- 98.6 F (37 C) Oral 62 16 100 % 5\' 2"  (1.575 m) 79.8 kg  10/29/19 0034 124/84 99.3 F (37.4 C) Oral 77 18 100 % -- --   Results for orders placed or performed during the hospital encounter of 10/29/19 (from the past 24 hour(s))  POC Urine Pregnancy, ED (not at [facility])  Status: Abnormal   Collection Time: 10/29/19 12:54 AM  Result Value Ref Range   Preg Test, Ur POSITIVE (A) NEGATIVE  Urinalysis, Routine w reflex microscopic     Status: Abnormal   Collection Time: 10/29/19  1:49 AM  Result Value Ref Range   Color, Urine YELLOW YELLOW   APPearance HAZY (A) CLEAR   Specific Gravity, Urine 1.013 1.005 - 1.030   pH 6.0 5.0 - 8.0   Glucose, UA NEGATIVE NEGATIVE mg/dL   Hgb urine dipstick NEGATIVE NEGATIVE   Bilirubin Urine NEGATIVE NEGATIVE   Ketones, ur NEGATIVE NEGATIVE mg/dL   Protein, ur NEGATIVE NEGATIVE mg/dL   Nitrite NEGATIVE NEGATIVE   Leukocytes,Ua NEGATIVE NEGATIVE  CBC     Status: None   Collection Time: 10/29/19  1:50 AM  Result Value Ref Range   WBC 9.4 4.0 - 10.5 K/uL   RBC 4.57 3.87 - 5.11 MIL/uL   Hemoglobin 13.1 12.0 - 15.0 g/dL   HCT 81.8 36 - 46 %   MCV 88.6 80.0 - 100.0 fL   MCH 28.7 26.0 - 34.0 pg   MCHC 32.3 30.0 - 36.0 g/dL   RDW 56.3 14.9 - 70.2 %   Platelets 274 150 - 400 K/uL    nRBC 0.0 0.0 - 0.2 %  hCG, quantitative, pregnancy     Status: Abnormal   Collection Time: 10/29/19  1:50 AM  Result Value Ref Range   hCG, Beta Chain, Quant, S 129,452 (H) <5 mIU/mL  ABO/Rh     Status: None   Collection Time: 10/29/19  1:50 AM  Result Value Ref Range   ABO/RH(D) O POS    No rh immune globuloin      NOT A RH IMMUNE GLOBULIN CANDIDATE, PT RH POSITIVE Performed at [facility], [address], [place], [place] [address]    US OB LESS THAN 14 WEEKS WITH OB TRANSVAGINAL  Result Date: 10/29/2019 CLINICAL DATA:  Sharp abdominal pain EXAM: OBSTETRIC <14 WK ULTRASOUND TECHNIQUE: Transabdominal ultrasound was performed for evaluation of the gestation as well as the maternal uterus and adnexal regions. COMPARISON:  None. FINDINGS: Intrauterine gestational sac: Single Yolk sac:  Visualized. Embryo:  Visualized. Cardiac Activity: Visualized. Heart Rate: 130 bpm CRL:   9.2 mm   6 w 6 d                  [place] EDC: 06/17/2020 Subchorionic hemorrhage:  None visualized. Maternal uterus/adnexae: Left ovary measures 2.2 x 1.5 x 1.6 cm and the right ovary measures 3.5 x 2.7 x 1.9 cm. Likely corpus luteum cyst in the right ovary. Trace free fluid in the cul-de-sac. IMPRESSION: 1. Single live intrauterine pregnancy as above, estimated age 72 weeks and 6 days. 2. Trace pelvic free fluid, likely physiologic. Electronically Signed   By: Sharlet Salina M.D.   On: 10/29/2019 03:15   Assessment and Plan  --29 y.o. [id number] with SIUP at [redacted]w[redacted]d  --New rx Phenergan for N/V --Discharge home in stable condition with first trimester precautions  Calvert Cantor, CNM 10/29/2019, 3:44 AM

## 2019-10-29 NOTE — ED Triage Notes (Signed)
Pt is [redacted] weeks pregnant, reports increased fatigue and not feeling well. LMP 09/09/19. Reports working around [employer] patients and is concerned for that.

## 2019-11-03 ENCOUNTER — Other Ambulatory Visit: Payer: Self-pay

## 2019-11-03 ENCOUNTER — Encounter (HOSPITAL_COMMUNITY): Payer: Self-pay | Admitting: Obstetrics and Gynecology

## 2019-11-03 ENCOUNTER — Inpatient Hospital Stay (HOSPITAL_COMMUNITY): Payer: Medicaid Other

## 2019-11-03 ENCOUNTER — Inpatient Hospital Stay (HOSPITAL_COMMUNITY)
Admission: AD | Admit: 2019-11-03 | Discharge: 2019-11-03 | Disposition: A | Discharge status: Home / Self Care | Payer: Medicaid Other | Attending: Obstetrics and Gynecology | Admitting: Obstetrics and Gynecology

## 2019-11-03 DIAGNOSIS — Z6790 Unspecified blood type, Rh positive: Secondary | ICD-10-CM | POA: Diagnosis not present

## 2019-11-03 DIAGNOSIS — O039 Complete or unspecified spontaneous abortion without complication: Secondary | ICD-10-CM | POA: Diagnosis not present

## 2019-11-03 DIAGNOSIS — O209 Hemorrhage in early pregnancy, unspecified: Secondary | ICD-10-CM | POA: Diagnosis present

## 2019-11-03 LAB — CBC
HCT: 33.7 % — ABNORMAL LOW (ref 36.0–46.0)
Hemoglobin: 11.1 g/dL — ABNORMAL LOW (ref 12.0–15.0)
MCH: 28.8 pg (ref 26.0–34.0)
MCHC: 32.9 g/dL (ref 30.0–36.0)
MCV: 87.5 fL (ref 80.0–100.0)
Platelets: 261 10*3/uL (ref 150–400)
RBC: 3.85 MIL/uL — ABNORMAL LOW (ref 3.87–5.11)
RDW: 14.2 % (ref 11.5–15.5)
WBC: 8.8 10*3/uL (ref 4.0–10.5)
nRBC: 0 % (ref 0.0–0.2)

## 2019-11-03 LAB — URINALYSIS, ROUTINE W REFLEX MICROSCOPIC
Bilirubin Urine: NEGATIVE
Glucose, UA: NEGATIVE mg/dL
Ketones, ur: 5 mg/dL — AB
Nitrite: NEGATIVE
Protein, ur: NEGATIVE mg/dL
Specific Gravity, Urine: 1.029 (ref 1.005–1.030)
pH: 5 (ref 5.0–8.0)

## 2019-11-03 LAB — HCG, QUANTITATIVE, PREGNANCY: hCG, Beta Chain, Quant, S: 9913 m[IU]/mL — ABNORMAL HIGH (ref <5)

## 2019-11-03 MED ORDER — IBUPROFEN 600 MG PO TABS: 600.0000 mg | ORAL_TABLET | Freq: Four times a day (QID) | ORAL | 0 refills | Status: DC | PRN

## 2019-11-03 MED ORDER — MISOPROSTOL 200 MCG PO TABS
800.0000 ug | ORAL_TABLET | Freq: Once | ORAL | Status: AC
  Administered 2019-11-03: 800 ug via ORAL
  Filled 2019-11-03: qty 4

## 2019-11-03 NOTE — MAU Provider Note (Signed)
History     CSN: [id number]  Arrival date and time: 11/03/19 0756   First Provider Initiated Contact with Patient 11/03/19 [phone]      Chief Complaint  Patient presents with   Vaginal Bleeding   29 y.o. [id number] @[redacted]w[redacted]d  with known IUP presenting for VB. Reports onset 3 days ago. States was heavy, soaked 5 pads and passed large clot. Bleeding has slowed down and varied in amt since, scant today only staining pad. Endorses intermittent mild cramping. She is eating and drinking w/o problem. No fevers.   OB History    Gravida  7   Para  1   Term  1   Preterm      AB  5   Living  1     SAB      TAB  5   Ectopic      Multiple      Live Births  1           Past Medical History:  Diagnosis Date   GERD (gastroesophageal reflux disease)    Lactose intolerance     Past Surgical History:  Procedure Laterality Date   INDUCED ABORTION      History reviewed. No pertinent family history.  Social History   Tobacco Use   Smoking status: Never Smoker   Smokeless tobacco: Never Used  Substance Use Topics   Alcohol use: No   Drug use: No    Allergies: No Known Allergies  Medications Prior to Admission  Medication Sig Dispense Refill Last Dose   acetaminophen (TYLENOL) 500 MG tablet Take 500 mg by mouth every 6 (six) hours as needed for moderate pain or headache.   Past Week at Unknown time   Prenatal Vit-Fe Fumarate-FA (PRENATAL MULTIVITAMIN) TABS tablet Take 1 tablet by mouth daily at 12 noon.   Past Week at Unknown time   promethazine (PHENERGAN) 12.5 MG tablet Take 1 tablet (12.5 mg total) by mouth every 6 (six) hours as needed for nausea or vomiting. 30 tablet 0 Past Week at Unknown time   valACYclovir (VALTREX) 1000 MG tablet Take 500 mg by mouth 2 (two) times daily as needed (out breaks). For 3 days   Past Week at Unknown time    Review of Systems  Constitutional: Negative for fever.  Gastrointestinal: Positive for abdominal pain.   Genitourinary: Positive for vaginal bleeding.  Neurological: Negative for light-headedness.   Physical Exam   Blood pressure 117/69, pulse 63, temperature 98.6 F (37 C), temperature source Oral, resp. rate 18, height 5\' 2"  (1.575 m), weight 80.3 kg, last menstrual period 09/09/2019, SpO2 100 %.  Physical Exam Vitals and nursing note reviewed.  Constitutional:      Appearance: Normal appearance.  HENT:     Head: Normocephalic.  Cardiovascular:     Rate and Rhythm: Normal rate.  Pulmonary:     Effort: Pulmonary effort is normal. No respiratory distress.  Abdominal:     General: There is no distension.     Palpations: Abdomen is soft.     Tenderness: There is no abdominal tenderness.  Musculoskeletal:        General: Normal range of motion.  Skin:    General: Skin is warm and dry.  Neurological:     General: No focal deficit present.     Mental Status: She is alert and oriented to [place], place, and time.  Psychiatric:        Mood and Affect: Mood normal.  Behavior: Behavior normal.    Results for orders placed or performed during the hospital encounter of 11/03/19 (from the past 24 hour(s))  Urinalysis, Routine w reflex microscopic Urine, Clean Catch     Status: Abnormal   Collection Time: 11/03/19  8:41 AM  Result Value Ref Range   Color, Urine YELLOW YELLOW   APPearance HAZY (A) CLEAR   Specific Gravity, Urine 1.029 1.005 - 1.030   pH 5.0 5.0 - 8.0   Glucose, UA NEGATIVE NEGATIVE mg/dL   Hgb urine dipstick LARGE (A) NEGATIVE   Bilirubin Urine NEGATIVE NEGATIVE   Ketones, ur 5 (A) NEGATIVE mg/dL   Protein, ur NEGATIVE NEGATIVE mg/dL   Nitrite NEGATIVE NEGATIVE   Leukocytes,Ua TRACE (A) NEGATIVE   RBC / HPF 21-50 0 - 5 RBC/hpf   WBC, UA 6-10 0 - 5 WBC/hpf   Bacteria, UA RARE (A) NONE SEEN   Squamous Epithelial / LPF 6-10 0 - 5   Mucus PRESENT   CBC     Status: Abnormal   Collection Time: 11/03/19  8:59 AM  Result Value Ref Range   WBC 8.8 4.0 - 10.5  K/uL   RBC 3.85 (L) 3.87 - 5.11 MIL/uL   Hemoglobin 11.1 (L) 12.0 - 15.0 g/dL   HCT 62.9 (L) 36 - 46 %   MCV 87.5 80.0 - 100.0 fL   MCH 28.8 26.0 - 34.0 pg   MCHC 32.9 30.0 - 36.0 g/dL   RDW 52.8 41.3 - 24.4 %   Platelets 261 150 - 400 K/uL   nRBC 0.0 0.0 - 0.2 %   US OB Transvaginal  Result Date: 11/03/2019 CLINICAL DATA:  Pregnant. Heavy vaginal bleeding. Patient had recent previous scan October 29, 2019 which showed intrauterine gestational sac with fetal pole. EXAM: TRANSVAGINAL OB ULTRASOUND TECHNIQUE: Transvaginal ultrasound was performed for complete evaluation of the gestation as well as the maternal uterus, adnexal regions, and pelvic cul-de-sac. COMPARISON:  October 29, 2019 FINDINGS: Intrauterine gestational sac: None Yolk sac:  Not Visualized. Embryo:  Not Visualized. Cardiac Activity: Not Visualized. Heart Rate:  bpm MSD:   mm    w     d CRL:     mm    w  d                  [place] EDC: Subchorionic hemorrhage:  None visualized. Maternal uterus/adnexae: The endometrium is heterogeneous and thickened measuring 33 mm. IMPRESSION: Findings consistent with interval spontaneous abortion. Electronically Signed   By: Sherian Rein M.D.   On: 11/03/2019 09:20   MAU Course  Procedures  MDM Labs and [place] ordered and reviewed. SAB confirmed by [place], recommend dose of Cytotec for thickened endometrium. Discussed findings with pt, condolences given. Pt agrees to Cytotec. Stable for discharge home.  Assessment and Plan   1. SAB (spontaneous abortion)   2. Blood type, Rh positive    Discharge home Follow up at [facility] in 2 weeks- message sent Pelvic rest Bleeding/pain return precautions Rx Ibuprofen OOW tonight- not provided  Allergies as of 11/03/2019   No Known Allergies     Medication List    TAKE these medications   acetaminophen 500 MG tablet Commonly known as: TYLENOL Take 500 mg by mouth every 6 (six) hours as needed for moderate pain or headache.   ibuprofen 600 MG  tablet Commonly known as: ADVIL Take 1 tablet (600 mg total) by mouth every 6 (six) hours as needed.   prenatal multivitamin Tabs tablet Take  1 tablet by mouth daily at 12 noon.   promethazine 12.5 MG tablet Commonly known as: PHENERGAN Take 1 tablet (12.5 mg total) by mouth every 6 (six) hours as needed for nausea or vomiting.   valACYclovir 1000 MG tablet Commonly known as: VALTREX Take 500 mg by mouth 2 (two) times daily as needed (out breaks). For 3 days      Donette Larry, [place] 11/03/2019, 9:59 AM

## 2019-11-03 NOTE — MAU Note (Signed)
Presents with c/o VB that Monday, reports VB amount has varied since then.  States some days heavier than others.  States passing small clots.  Also c/o intermittent  lower abdominal cramping.

## 2019-11-03 NOTE — MAU Note (Signed)
Not enough urine for culture tube 

## 2019-11-03 NOTE — Discharge Instructions (Signed)
Miscarriage A miscarriage is the loss of an unborn baby (fetus) before the 20th week of pregnancy. Follow these instructions at home: Medicines   Take over-the-counter and prescription medicines only as told by your doctor.  If you were prescribed antibiotic medicine, take it as told by your doctor. Do not stop taking the antibiotic even if you start to feel better.  Do not take NSAIDs unless your doctor says that this is safe for you. NSAIDs include aspirin and ibuprofen. These medicines can cause bleeding. Activity  Rest as directed. Ask your doctor what activities are safe for you.  Have someone help you at home during this time. General instructions  Write down how many pads you use each day and how soaked they are.  Watch the amount of tissue or clumps of blood (blood clots) that you pass from your vagina. Save any large amounts of tissue for your doctor.  Do not use tampons, douche, or have sex until your doctor approves.  To help you and your partner with the process of grieving, talk with your doctor or seek counseling.  When you are ready, meet with your doctor to talk about steps you should take for your health. Also, talk with your doctor about steps to take to have a healthy pregnancy in the future.  Keep all follow-up visits as told by your doctor. This is important. Contact a doctor if:  You have a fever or chills.  You have vaginal discharge that smells bad.  You have more bleeding. Get help right away if:  You have very bad cramps or pain in your back or belly.  You pass clumps of blood that are walnut-sized or larger from your vagina.  You pass tissue that is walnut-sized or larger from your vagina.  You soak more than 1 regular pad in an hour.  You get light-headed or weak.  You faint (pass out).  You have feelings of sadness that do not go away, or you have thoughts of hurting yourself. Summary  A miscarriage is the loss of an unborn baby before  the 20th week of pregnancy.  Follow your doctor's instructions for home care. Keep all follow-up appointments.  To help you and your partner with the process of grieving, talk with your doctor or seek counseling. This information is not intended to replace advice given to you by your health care provider. Make sure you discuss any questions you have with your health care provider. Document Revised: 05/21/2018 Document Reviewed: 03/04/2016 Elsevier Patient Education  2020 Elsevier Inc.  

## 2019-11-22 ENCOUNTER — Ambulatory Visit: Payer: Medicaid Other | Admitting: Advanced Practice Midwife

## 2020-02-23 ENCOUNTER — Other Ambulatory Visit: Payer: Medicaid Other

## 2020-02-23 DIAGNOSIS — Z20822 Contact with and (suspected) exposure to covid-19: Secondary | ICD-10-CM

## 2020-02-25 LAB — SARS-COV-2, NAA 2 DAY TAT

## 2020-02-25 LAB — NOVEL CORONAVIRUS, NAA: SARS-CoV-2, NAA: NOT DETECTED

## 2020-10-02 ENCOUNTER — Encounter (HOSPITAL_COMMUNITY): Payer: Self-pay | Admitting: *Deleted

## 2020-10-02 ENCOUNTER — Inpatient Hospital Stay (HOSPITAL_COMMUNITY)
Admission: AD | Admit: 2020-10-02 | Discharge: 2020-10-02 | Disposition: A | Discharge status: Home / Self Care | Payer: Medicaid Other | Attending: Obstetrics & Gynecology | Admitting: Obstetrics & Gynecology

## 2020-10-02 ENCOUNTER — Inpatient Hospital Stay (HOSPITAL_COMMUNITY): Payer: Medicaid Other

## 2020-10-02 ENCOUNTER — Other Ambulatory Visit: Payer: Self-pay

## 2020-10-02 DIAGNOSIS — Z79899 Other long term (current) drug therapy: Secondary | ICD-10-CM | POA: Diagnosis not present

## 2020-10-02 DIAGNOSIS — R197 Diarrhea, unspecified: Secondary | ICD-10-CM | POA: Diagnosis not present

## 2020-10-02 DIAGNOSIS — Z3A01 Less than 8 weeks gestation of pregnancy: Secondary | ICD-10-CM | POA: Diagnosis not present

## 2020-10-02 DIAGNOSIS — O209 Hemorrhage in early pregnancy, unspecified: Secondary | ICD-10-CM | POA: Diagnosis not present

## 2020-10-02 DIAGNOSIS — O26851 Spotting complicating pregnancy, first trimester: Secondary | ICD-10-CM | POA: Diagnosis not present

## 2020-10-02 DIAGNOSIS — O26891 Other specified pregnancy related conditions, first trimester: Secondary | ICD-10-CM | POA: Diagnosis not present

## 2020-10-02 DIAGNOSIS — O3680X0 Pregnancy with inconclusive fetal viability, not applicable or unspecified: Secondary | ICD-10-CM

## 2020-10-02 DIAGNOSIS — Z6740 Type O blood, Rh positive: Secondary | ICD-10-CM | POA: Diagnosis not present

## 2020-10-02 DIAGNOSIS — Z3401 Encounter for supervision of normal first pregnancy, first trimester: Secondary | ICD-10-CM | POA: Diagnosis present

## 2020-10-02 HISTORY — DX: Headache, unspecified: R51.9

## 2020-10-02 HISTORY — DX: Unspecified ovarian cyst, unspecified side: N83.209

## 2020-10-02 LAB — CBC
HCT: 39.1 % (ref 36.0–46.0)
Hemoglobin: 12.7 g/dL (ref 12.0–15.0)
MCH: 28.1 pg (ref 26.0–34.0)
MCHC: 32.5 g/dL (ref 30.0–36.0)
MCV: 86.5 fL (ref 80.0–100.0)
Platelets: 284 10*3/uL (ref 150–400)
RBC: 4.52 MIL/uL (ref 3.87–5.11)
RDW: 14.5 % (ref 11.5–15.5)
WBC: 8.1 10*3/uL (ref 4.0–10.5)
nRBC: 0 % (ref 0.0–0.2)

## 2020-10-02 LAB — URINALYSIS, ROUTINE W REFLEX MICROSCOPIC
Bacteria, UA: NONE SEEN
Bilirubin Urine: NEGATIVE
Glucose, UA: NEGATIVE mg/dL
Hgb urine dipstick: NEGATIVE
Ketones, ur: 5 mg/dL — AB
Nitrite: NEGATIVE
Protein, ur: NEGATIVE mg/dL
Specific Gravity, Urine: 1.026 (ref 1.005–1.030)
pH: 5 (ref 5.0–8.0)

## 2020-10-02 LAB — HCG, QUANTITATIVE, PREGNANCY: hCG, Beta Chain, Quant, S: 3574 m[IU]/mL — ABNORMAL HIGH (ref <5)

## 2020-10-02 LAB — POCT PREGNANCY, URINE: Preg Test, Ur: POSITIVE — AB

## 2020-10-02 MED ORDER — PROMETHAZINE HCL 25 MG PO TABS: 25.0000 mg | ORAL_TABLET | Freq: Four times a day (QID) | ORAL | 0 refills | Status: DC | PRN

## 2020-10-02 NOTE — MAU Note (Addendum)
Period is late. +preg test yesterday.  Went to eat, and been on the toilet ever since, diarrhea- was loose and regular stool. Saw some blood yesterday afternoon, just the one time- mainly when she wiped.  Diarrhea continued until 10p.m.  denies vomiting or fever.  No one else been sick. Denies pain or cramping this morning. Pt thinks diarrhea was from "leftover meat she ate".

## 2020-10-02 NOTE — MAU Note (Signed)
Pt reported was recently tested for yeast and STD's

## 2020-10-02 NOTE — MAU Provider Note (Signed)
History     CSN: [id number]  Arrival date and time: 10/02/20 8315  Event Date/Time  First Provider Initiated Contact with Patient 10/02/20 1149     Chief Complaint  Patient presents with   Vaginal Bleeding   Possible Pregnancy   Diarrhea   HPI Teresa Bryant is a 30 y.o. [id number] in early pregnancy who presents to MAU with chief complaint of vaginal bleeding in the setting of positive home pregnancy test. This is a new problem, onset yesterday afternoon. Patient is visualizing smears of blood when she wipes. She denies heavy vaginal bleeding. She has not needed to done a pad.  Patient also complains of diarrhea, now resolved. She states she ate two day old meat and experienced new onset loose stool as she was finishing that meal. Her diarrhea has since resolved without intervention.  She denies abdominal pain, dysuria, weakness, fever, syncope.  OB History     Gravida  7   Para  1   Term  1   Preterm      AB  5   Living  1      SAB      IAB  5   Ectopic      Multiple      Live Births  1           Past Medical History:  Diagnosis Date   GERD (gastroesophageal reflux disease)    Headache    Lactose intolerance    Ovarian cyst     Past Surgical History:  Procedure Laterality Date   INDUCED ABORTION      Family History  Problem Relation Age of Onset   Healthy Mother    Healthy Father     Social History   Tobacco Use   Smoking status: Never   Smokeless tobacco: Never  Vaping Use   Vaping Use: Never used  Substance Use Topics   Alcohol use: No   Drug use: No    Allergies: No Known Allergies  Medications Prior to Admission  Medication Sig Dispense Refill Last Dose   cetirizine (ZYRTEC) 10 MG tablet Take 10 mg by mouth daily.   Past Month   ibuprofen (ADVIL) 600 MG tablet Take 1 tablet (600 mg total) by mouth every 6 (six) hours as needed. 30 tablet 0 10/01/2020   pantoprazole (PROTONIX) 20 MG tablet Take 20 mg by mouth daily.   10/01/2020    valACYclovir (VALTREX) 1000 MG tablet Take 500 mg by mouth 2 (two) times daily as needed (out breaks). For 3 days   Past Month   acetaminophen (TYLENOL) 500 MG tablet Take 500 mg by mouth every 6 (six) hours as needed for moderate pain or headache.      Prenatal Vit-Fe Fumarate-FA (PRENATAL MULTIVITAMIN) TABS tablet Take 1 tablet by mouth daily at 12 noon.      promethazine (PHENERGAN) 12.5 MG tablet Take 1 tablet (12.5 mg total) by mouth every 6 (six) hours as needed for nausea or vomiting. 30 tablet 0     Review of Systems  Genitourinary:  Positive for vaginal bleeding.  All other systems reviewed and are negative. Physical Exam   Blood pressure 117/69, pulse 78, temperature 99.5 F (37.5 C), temperature source Oral, resp. rate 18, height 5\' 2"  (1.575 m), weight 81.1 kg, last menstrual period 08/22/2020, SpO2 99 %, unknown if currently breastfeeding.  Physical Exam Vitals and nursing note reviewed. Exam conducted with a chaperone present.  Constitutional:      Appearance: Normal  appearance.  Cardiovascular:     Rate and Rhythm: Normal rate and regular rhythm.     Pulses: Normal pulses.     Heart sounds: Normal heart sounds.  Pulmonary:     Effort: Pulmonary effort is normal.     Breath sounds: Normal breath sounds.  Abdominal:     General: Bowel sounds are normal.     Tenderness: There is no abdominal tenderness.  Genitourinary:    Comments: Deferred due to description of scant bleeding Skin:    Capillary Refill: Capillary refill takes less than 2 seconds.  Neurological:     Mental Status: She is alert and oriented to [place], place, and time.  Psychiatric:        Mood and Affect: Mood normal.        Behavior: Behavior normal.        Thought Content: Thought content normal.        Judgment: Judgment normal.    MAU Course/MDM  Procedures  Orders Placed This Encounter  Procedures   US OB LESS THAN 14 WEEKS WITH OB TRANSVAGINAL   Urinalysis, Routine w reflex  microscopic Urine, Clean Catch   CBC   hCG, quantitative, pregnancy   Pregnancy, urine POC   Discharge patient  US OB LESS THAN 14 WEEKS WITH OB TRANSVAGINAL  Result Date: 10/02/2020 CLINICAL DATA:  Vaginal bleeding. EXAM: OBSTETRIC <14 WK [place] AND TRANSVAGINAL OB US TECHNIQUE: Both transabdominal and transvaginal ultrasound examinations were performed for complete evaluation of the gestation as well as the maternal uterus, adnexal regions, and pelvic cul-de-sac. Transvaginal technique was performed to assess early pregnancy. COMPARISON:  None. FINDINGS: Intrauterine gestational sac: Single Yolk sac:  None Embryo:  None Cardiac Activity: None MSD: 4.4 mm   5 w   1 d Subchorionic hemorrhage:  None visualized. Maternal uterus/adnexae: Right ovary: Normal Left ovary: Normal Other :None Free fluid:  Small IMPRESSION: 1. Probable early intrauterine gestational sac, but no yolk sac, fetal pole, or cardiac activity yet visualized. Recommend follow-up quantitative B-HCG levels and follow-up US in 14 days to confirm and assess viability. This recommendation follows SRU consensus guidelines: Diagnostic Criteria for Nonviable Pregnancy Early in the First Trimester. Malva Limes Med 2013; 944:9675-91. Electronically Signed   By: Signa Kell M.D.   On: 10/02/2020 11:46    Assessment and Plan  --30 y.o. [id number] with pregnancy of unknown location --Quant hCG 3574 --Hgb 12.7 --Blood type O POS --Discharge home in stable condition  F/U --Repeat stat Quant hCG in 48 hours --Appt made at [facility] at 1pm on 08/25   Calvert Cantor, CNM

## 2020-10-02 NOTE — Discharge Instructions (Signed)

## 2020-10-04 ENCOUNTER — Other Ambulatory Visit: Payer: Self-pay

## 2020-10-04 ENCOUNTER — Ambulatory Visit (INDEPENDENT_AMBULATORY_CARE_PROVIDER_SITE_OTHER): Payer: Medicaid Other

## 2020-10-04 ENCOUNTER — Telehealth: Payer: Self-pay

## 2020-10-04 DIAGNOSIS — O209 Hemorrhage in early pregnancy, unspecified: Secondary | ICD-10-CM

## 2020-10-04 NOTE — Progress Notes (Signed)
Pt is in the office for STAT HCG. Pt states that she is not having any bleeding, just cramping 5/10.

## 2020-10-04 NOTE — Progress Notes (Signed)
Patient was assessed and managed by nursing staff during this encounter. I have reviewed the chart and agree with the documentation and plan. I have also made any necessary editorial changes.  Stat quant for 48 hour follow up of pregnancy of unknown location and bleeding.  Warden Fillers, MD 10/04/2020 1:32 PM

## 2020-10-04 NOTE — Telephone Encounter (Signed)
S/w patient and advised of STAT HCG results. Advised that per provider she should return to MAU in 48 hours to have repeat hcg. Provided ectopic precautions, pt agreed.

## 2020-10-05 LAB — BETA HCG QUANT (REF LAB): hCG Quant: 4874 m[IU]/mL

## 2020-10-06 ENCOUNTER — Inpatient Hospital Stay (HOSPITAL_COMMUNITY)
Admission: AD | Admit: 2020-10-06 | Discharge: 2020-10-06 | Disposition: A | Discharge status: Home / Self Care | Payer: Medicaid Other | Attending: Obstetrics and Gynecology | Admitting: Obstetrics and Gynecology

## 2020-10-06 DIAGNOSIS — Z6740 Type O blood, Rh positive: Secondary | ICD-10-CM | POA: Insufficient documentation

## 2020-10-06 DIAGNOSIS — O3680X0 Pregnancy with inconclusive fetal viability, not applicable or unspecified: Secondary | ICD-10-CM | POA: Diagnosis not present

## 2020-10-06 DIAGNOSIS — Z3A00 Weeks of gestation of pregnancy not specified: Secondary | ICD-10-CM | POA: Diagnosis not present

## 2020-10-06 DIAGNOSIS — O26891 Other specified pregnancy related conditions, first trimester: Secondary | ICD-10-CM | POA: Diagnosis not present

## 2020-10-06 LAB — HCG, QUANTITATIVE, PREGNANCY: hCG, Beta Chain, Quant, S: 11947 m[IU]/mL — ABNORMAL HIGH (ref <5)

## 2020-10-06 NOTE — MAU Note (Signed)
Here for repeat blood hormone level.  Been having little stomach cramps since she left here on Tues. No bleeding.

## 2020-10-06 NOTE — MAU Provider Note (Signed)
Event Date/Time  First Provider Initiated Contact with Patient 10/06/20 1053      S Ms. Teresa Bryant is a 30 y.o. [id number] patient who presents to MAU today for repeat Quant hCG s/p inappropriate rise on quant at [facility] on 10/04/2020. Patient endorses ongoing lower abdominal cramping but says it has improved over time and is not just an occasional "throb a couple times per day". Pain score 3/10. She denies vaginal bleeding.  O BP 108/72 (BP Location: Right Arm)   Pulse 61   Temp 98.1 F (36.7 C) (Oral)   Resp 16   Ht 5\' 2"  (1.575 m)   Wt 83.2 kg   LMP 08/22/2020   SpO2 100%   BMI 33.56 kg/m    Physical Exam Vitals and nursing note reviewed. Exam conducted with a chaperone present.  Constitutional:      Appearance: Normal appearance.  Cardiovascular:     Rate and Rhythm: Normal rate.     Pulses: Normal pulses.  Pulmonary:     Effort: Pulmonary effort is normal.  Abdominal:     Tenderness: There is no abdominal tenderness.  Skin:    Capillary Refill: Capillary refill takes less than 2 seconds.  Neurological:     Mental Status: She is alert and oriented to [place], place, and time.  Psychiatric:        Mood and Affect: Mood normal.        Behavior: Behavior normal.        Thought Content: Thought content normal.        Judgment: Judgment normal.    A Medical screening exam complete Quant hCG 3574 on 08/23 (MAU) Quant hCG 4874 on 08/25 ([facility]) Quant hCG 9/25 today  Appropriate rise, reassuring surveillance Blood type O POS   P Discharge from MAU in stable condition Ectopic and bleeding precautions reviewed by CNM  F/U: Repeat viability scan on or around 10/15/2020 at [facility]. Orders placed  [facility], Clayton Bibles 10/06/2020 11:55 AM

## 2020-10-12 ENCOUNTER — Telehealth: Payer: Self-pay | Admitting: Lactation Services

## 2020-10-12 NOTE — Telephone Encounter (Signed)
Patient called in to see when [place] has been scheduled. Per Chart review, [place] has not been scheduled.   Patient asking why she needs [place] and lab levels if ectopic. Reviewed with her that at this time we are trying to determine if the pregnancy is ectopic or intrauterine and that the earlier [place] may have been too early to see ever if in the uterus and an follow up US should tell us if the pregnancy is located in the uterus. Patient requested a morning appointment. Reviewed blood levels increasing can be reassuring but blood levels decreasing would indicate a failed pregnancy.   Called Radiology and [place] Scheduled for 9/14 at 8 am, arrive at 7:45 with a full bladder.   Called patient back and informed her of appointment date, time, location and full bladder.   Patient reports she is still having some mild abdominal pain in the center of lower abdomen. She has no bleeding. Reviewed uterine cramping is not uncommon in early pregnancy as the uterus stretches.   Patient informed if abdominal pain increases or she has vaginal bleeding, she is to go back to the Maternity Assessment Unit for reevaluation.   Patient with no other questions or concerns.

## 2020-10-24 ENCOUNTER — Ambulatory Visit
Admission: RE | Admit: 2020-10-24 | Discharge: 2020-10-24 | Disposition: A | Discharge status: Home / Self Care | Payer: Medicaid Other | Source: Ambulatory Visit | Attending: Advanced Practice Midwife | Admitting: Advanced Practice Midwife

## 2020-10-24 ENCOUNTER — Other Ambulatory Visit: Payer: Self-pay

## 2020-10-24 ENCOUNTER — Ambulatory Visit (INDEPENDENT_AMBULATORY_CARE_PROVIDER_SITE_OTHER): Payer: Medicaid Other

## 2020-10-24 DIAGNOSIS — Z3490 Encounter for supervision of normal pregnancy, unspecified, unspecified trimester: Secondary | ICD-10-CM

## 2020-10-24 DIAGNOSIS — O3680X0 Pregnancy with inconclusive fetal viability, not applicable or unspecified: Secondary | ICD-10-CM | POA: Insufficient documentation

## 2020-10-24 NOTE — Progress Notes (Signed)
Pt here for results. Pt given pictures of IUP with FHR from MFM. Pt denies any pain, bleeding at this time. Pt was advised to call back to [facility] to be seen for new OB care. Pt given number and address. Pt verbalized understanding.   Judeth Cornfield, RN

## 2020-10-24 NOTE — Patient Instructions (Signed)
Prenatal Care Providers    [facility]   [address]  [phone]

## 2021-07-10 ENCOUNTER — Ambulatory Visit (INDEPENDENT_AMBULATORY_CARE_PROVIDER_SITE_OTHER): Payer: Medicaid Other

## 2021-07-10 DIAGNOSIS — Z3201 Encounter for pregnancy test, result positive: Secondary | ICD-10-CM

## 2021-07-10 LAB — POCT PREGNANCY, URINE: Preg Test, Ur: POSITIVE — AB

## 2021-07-10 NOTE — Progress Notes (Signed)
Pt left urine for UPT resulting positive.  Attempted to contact pt @ 1200 and left message that I am calling with results if she could please call the office.    Pt notified of positive pregnancy test.  Pt reports having left sided pain mostly at night time when she is going to bed.  Medications/allergies reviewed.  Pt reports LMP of 06/06/21, EDD 03/13/22, and 4w 6d today.  Pt encouraged to go to MAU for evaluation with the c/o left sided pain.  Pt reports that she has GERD and has not taken her medication and also ate dairy.  Pt states that she feels that is where the pain is coming from.  I informed pt that I understand however to rule out ectopic, I would encourage that she goes to MAU.  Pt verbalized understanding with no further questions.    Teresa Bryant  07/10/21

## 2021-07-11 ENCOUNTER — Other Ambulatory Visit: Payer: Self-pay

## 2021-07-11 ENCOUNTER — Inpatient Hospital Stay (HOSPITAL_COMMUNITY): Payer: Medicaid Other

## 2021-07-11 ENCOUNTER — Inpatient Hospital Stay (HOSPITAL_COMMUNITY)
Admission: AD | Admit: 2021-07-11 | Discharge: 2021-07-11 | Disposition: A | Discharge status: Home / Self Care | Payer: Medicaid Other | Attending: Obstetrics & Gynecology | Admitting: Obstetrics & Gynecology

## 2021-07-11 ENCOUNTER — Encounter (HOSPITAL_COMMUNITY): Payer: Self-pay | Admitting: *Deleted

## 2021-07-11 DIAGNOSIS — Z3A01 Less than 8 weeks gestation of pregnancy: Secondary | ICD-10-CM | POA: Diagnosis not present

## 2021-07-11 DIAGNOSIS — O3680X0 Pregnancy with inconclusive fetal viability, not applicable or unspecified: Secondary | ICD-10-CM | POA: Diagnosis not present

## 2021-07-11 DIAGNOSIS — B9689 Other specified bacterial agents as the cause of diseases classified elsewhere: Secondary | ICD-10-CM

## 2021-07-11 DIAGNOSIS — R102 Pelvic and perineal pain: Secondary | ICD-10-CM | POA: Insufficient documentation

## 2021-07-11 DIAGNOSIS — O23591 Infection of other part of genital tract in pregnancy, first trimester: Secondary | ICD-10-CM | POA: Diagnosis not present

## 2021-07-11 DIAGNOSIS — Z6740 Type O blood, Rh positive: Secondary | ICD-10-CM | POA: Diagnosis not present

## 2021-07-11 DIAGNOSIS — N760 Acute vaginitis: Secondary | ICD-10-CM | POA: Diagnosis not present

## 2021-07-11 DIAGNOSIS — R109 Unspecified abdominal pain: Secondary | ICD-10-CM

## 2021-07-11 DIAGNOSIS — O0941 Supervision of pregnancy with grand multiparity, first trimester: Secondary | ICD-10-CM | POA: Insufficient documentation

## 2021-07-11 DIAGNOSIS — N898 Other specified noninflammatory disorders of vagina: Secondary | ICD-10-CM | POA: Diagnosis not present

## 2021-07-11 DIAGNOSIS — O26891 Other specified pregnancy related conditions, first trimester: Secondary | ICD-10-CM | POA: Diagnosis not present

## 2021-07-11 DIAGNOSIS — R1030 Lower abdominal pain, unspecified: Secondary | ICD-10-CM | POA: Insufficient documentation

## 2021-07-11 LAB — GC/CHLAMYDIA PROBE AMP (~~LOC~~) NOT AT ARMC
Chlamydia: NEGATIVE
Comment: NEGATIVE
Comment: NORMAL
Neisseria Gonorrhea: NEGATIVE

## 2021-07-11 LAB — COMPREHENSIVE METABOLIC PANEL WITH GFR
ALT: 13 U/L (ref 0–44)
AST: 17 U/L (ref 15–41)
Albumin: 3.5 g/dL (ref 3.5–5.0)
Alkaline Phosphatase: 42 U/L (ref 38–126)
Anion gap: 5 (ref 5–15)
BUN: <5 mg/dL — ABNORMAL LOW (ref 6–20)
CO2: 24 mmol/L (ref 22–32)
Calcium: 9.1 mg/dL (ref 8.9–10.3)
Chloride: 109 mmol/L (ref 98–111)
Creatinine, Ser: 0.91 mg/dL (ref 0.44–1.00)
GFR, Estimated: >60 mL/min
Glucose, Bld: 96 mg/dL (ref 70–99)
Potassium: 3.8 mmol/L (ref 3.5–5.1)
Sodium: 138 mmol/L (ref 135–145)
Total Bilirubin: 0.5 mg/dL (ref 0.3–1.2)
Total Protein: 7.2 g/dL (ref 6.5–8.1)

## 2021-07-11 LAB — URINALYSIS, ROUTINE W REFLEX MICROSCOPIC
Bilirubin Urine: NEGATIVE
Glucose, UA: NEGATIVE mg/dL
Hgb urine dipstick: NEGATIVE
Ketones, ur: NEGATIVE mg/dL
Leukocytes,Ua: NEGATIVE
Nitrite: NEGATIVE
Protein, ur: NEGATIVE mg/dL
Specific Gravity, Urine: 1.014 (ref 1.005–1.030)
pH: 5 (ref 5.0–8.0)

## 2021-07-11 LAB — TYPE AND SCREEN
ABO/RH(D): O POS
Antibody Screen: NEGATIVE

## 2021-07-11 LAB — CBC
HCT: 38.4 % (ref 36.0–46.0)
Hemoglobin: 12.7 g/dL (ref 12.0–15.0)
MCH: 28.7 pg (ref 26.0–34.0)
MCHC: 33.1 g/dL (ref 30.0–36.0)
MCV: 86.7 fL (ref 80.0–100.0)
Platelets: 253 10*3/uL (ref 150–400)
RBC: 4.43 MIL/uL (ref 3.87–5.11)
RDW: 13.3 % (ref 11.5–15.5)
WBC: 7 10*3/uL (ref 4.0–10.5)
nRBC: 0 % (ref 0.0–0.2)

## 2021-07-11 LAB — WET PREP, GENITAL
Sperm: NONE SEEN
Trich, Wet Prep: NONE SEEN
WBC, Wet Prep HPF POC: <10
Yeast Wet Prep HPF POC: NONE SEEN

## 2021-07-11 LAB — HCG, QUANTITATIVE, PREGNANCY: hCG, Beta Chain, Quant, S: 1620 m[IU]/mL — ABNORMAL HIGH (ref <5)

## 2021-07-11 MED ORDER — METRONIDAZOLE 0.75 % VA GEL: 1.0000 | Freq: Every day | VAGINAL | 0 refills | Status: AC

## 2021-07-11 NOTE — MAU Provider Note (Signed)
History     [id number]  Arrival date and time: 07/11/21 8295    Chief Complaint  Patient presents with   Abdominal Pain     HPI Teresa Bryant is a 31 y.o. at [redacted]w[redacted]d by LMP, who presents for abdominal pain.   Review of chart shows she was advised to come to MAU yesterday to rule out ectopic  Today patient reports she has had pelvic pain for the past few days Primarily in the LLQ She denies any vaginal bleeding Had similar pain with last pregnancy Endorses some mild vaginal discharge No odor to discharge, no burning or itching vaginally No burning or pain with urination No nausea or vomiting No fevers No constipation   --/--/O POS (06/01 0931)  OB History     Gravida  8   Para  1   Term  1   Preterm      AB  6   Living  1      SAB      IAB  6   Ectopic      Multiple      Live Births  1           Past Medical History:  Diagnosis Date   GERD (gastroesophageal reflux disease)    Headache    Lactose intolerance    Ovarian cyst     Past Surgical History:  Procedure Laterality Date   INDUCED ABORTION      Family History  Problem Relation Age of Onset   Healthy Mother    Healthy Father     Social History   Socioeconomic History   Marital status: Single    Spouse name: Not on file   Number of children: Not on file   Years of education: Not on file   Highest education level: Not on file  Occupational History   Not on file  Tobacco Use   Smoking status: Never   Smokeless tobacco: Never  Vaping Use   Vaping Use: Never used  Substance and Sexual Activity   Alcohol use: No   Drug use: No   Sexual activity: Yes  Other Topics Concern   Not on file  Social History Narrative   Not on file   Social Determinants of Health   Financial Resource Strain: Not on file  Food Insecurity: Not on file  Transportation Needs: Not on file  Physical Activity: Not on file  Stress: Not on file  Social Connections: Not on file  Intimate Partner  Violence: Not on file    Allergies  Allergen Reactions   Bee Pollen    Cottonseed Oil    Mixed Grasses    Other     "Animals."  Red nose, rash, runny nose.    No current facility-administered medications on file prior to encounter.   Current Outpatient Medications on File Prior to Encounter  Medication Sig Dispense Refill   valACYclovir (VALTREX) 500 MG tablet Take 500 mg by mouth 2 (two) times daily.       ROS Pertinent positives and negative per HPI, all others reviewed and negative  Physical Exam   BP 113/60   Pulse 61   Temp 98.5 F (36.9 C) (Oral)   Resp 18   Ht 5' (1.524 m)   Wt 78.2 kg   LMP 06/06/2021   SpO2 99%   Breastfeeding Unknown   BMI 33.67 kg/m   Patient Vitals for the past 24 hrs:  BP Temp Temp src Pulse Resp SpO2 Height  Weight  07/11/21 0854 113/60 -- -- 61 -- -- -- --  07/11/21 0842 112/72 98.5 F (36.9 C) Oral 60 18 99 % 5' (1.524 m) 78.2 kg    Physical Exam Vitals reviewed.  Constitutional:      General: She is not in acute distress.    Appearance: She is well-developed. She is not diaphoretic.  Eyes:     General: No scleral icterus. Pulmonary:     Effort: Pulmonary effort is normal. No respiratory distress.  Abdominal:     General: There is no distension.     Palpations: Abdomen is soft.     Tenderness: There is no abdominal tenderness. There is no guarding or rebound.  Skin:    General: Skin is warm and dry.  Neurological:     Mental Status: She is alert.     Coordination: Coordination normal.     Cervical Exam    Bedside Ultrasound Pt informed that the ultrasound is considered a limited OB ultrasound and is not intended to be a complete ultrasound exam.  Patient also informed that the ultrasound is not being completed with the intent of assessing for fetal or placental anomalies or any pelvic abnormalities.  Explained that the purpose of today's ultrasound is to assess for  viability.  Patient acknowledges the purpose of  the exam and the limitations of the study.    My interpretation: difficult exam, possible IUGS seen but not definitely   Labs Results for orders placed or performed during the hospital encounter of 07/11/21 (from the past 24 hour(s))  Wet prep, genital     Status: Abnormal   Collection Time: 07/11/21  8:59 AM   Specimen: PATH Cytology Cervicovaginal Ancillary Only  Result Value Ref Range   Yeast Wet Prep HPF POC NONE SEEN NONE SEEN   Trich, Wet Prep NONE SEEN NONE SEEN   Clue Cells Wet Prep HPF POC PRESENT (A) NONE SEEN   WBC, Wet Prep HPF POC <10 <10   Sperm NONE SEEN   Urinalysis, Routine w reflex microscopic Urine, Clean Catch     Status: None   Collection Time: 07/11/21  9:01 AM  Result Value Ref Range   Color, Urine YELLOW YELLOW   APPearance CLEAR CLEAR   Specific Gravity, Urine 1.014 1.005 - 1.030   pH 5.0 5.0 - 8.0   Glucose, UA NEGATIVE NEGATIVE mg/dL   Hgb urine dipstick NEGATIVE NEGATIVE   Bilirubin Urine NEGATIVE NEGATIVE   Ketones, ur NEGATIVE NEGATIVE mg/dL   Protein, ur NEGATIVE NEGATIVE mg/dL   Nitrite NEGATIVE NEGATIVE   Leukocytes,Ua NEGATIVE NEGATIVE  CBC     Status: None   Collection Time: 07/11/21  9:31 AM  Result Value Ref Range   WBC 7.0 4.0 - 10.5 K/uL   RBC 4.43 3.87 - 5.11 MIL/uL   Hemoglobin 12.7 12.0 - 15.0 g/dL   HCT 51.7 61.6 - 07.3 %   MCV 86.7 80.0 - 100.0 fL   MCH 28.7 26.0 - 34.0 pg   MCHC 33.1 30.0 - 36.0 g/dL   RDW 71.0 62.6 - 94.8 %   Platelets 253 150 - 400 K/uL   nRBC 0.0 0.0 - 0.2 %  Comprehensive metabolic panel     Status: Abnormal   Collection Time: 07/11/21  9:31 AM  Result Value Ref Range   Sodium 138 135 - 145 mmol/L   Potassium 3.8 3.5 - 5.1 mmol/L   Chloride 109 98 - 111 mmol/L   CO2 24 22 - 32  mmol/L   Glucose, Bld 96 70 - 99 mg/dL   BUN <5 (L) 6 - 20 mg/dL   Creatinine, Ser 1.320.91 0.44 - 1.00 mg/dL   Calcium 9.1 8.9 - 44.010.3 mg/dL   Total Protein 7.2 6.5 - 8.1 g/dL   Albumin 3.5 3.5 - 5.0 g/dL   AST 17 15 - 41 U/L    ALT 13 0 - 44 U/L   Alkaline Phosphatase 42 38 - 126 U/L   Total Bilirubin 0.5 0.3 - 1.2 mg/dL   GFR, Estimated >10>60 >27>60 mL/min   Anion gap 5 5 - 15  Type and screen  [facility]     Status: None   Collection Time: 07/11/21  9:31 AM  Result Value Ref Range   ABO/RH(D) O POS    Antibody Screen NEG    Sample Expiration      07/14/2021,2359 Performed at [facility], [address] [place], [place] [address]   hCG, quantitative, pregnancy     Status: Abnormal   Collection Time: 07/11/21  9:31 AM  Result Value Ref Range   hCG, Beta Chain, Quant, S 1,620 (H) <5 mIU/mL    Imaging US OB LESS THAN 14 WEEKS WITH OB TRANSVAGINAL  Result Date: 07/11/2021 CLINICAL DATA:  A 31 year old female presents for evaluation of intermittent abdominal and pelvic pain in the setting of positive pregnancy test. No current quantitative beta HCG is available. Last menstrual cycle would estimate gestational age of [redacted] weeks and 0 days. EXAM: OBSTETRIC <14 WK US AND TRANSVAGINAL OB US TECHNIQUE: Both transabdominal and transvaginal ultrasound examinations were performed for complete evaluation of the gestation as well as the maternal uterus, adnexal regions, and pelvic cul-de-sac. Transvaginal technique was performed to assess early pregnancy. COMPARISON:  Prior imaging from September of 2022. FINDINGS: Intrauterine gestational sac: Potential intrauterine gestational sac is quite small and displays no visible yolk sac or fetal pole see below. Yolk sac:  Not visualized Embryo:  Not visualized MSD: 3.5 mm   5 w   1 d Subchorionic hemorrhage: Small subchorionic hemorrhage versus is blood in the endometrial canal near the potential gestation. Maternal uterus/adnexae: Transabdominal images are markedly limited by patient body habitus. Endovaginal images with endometrial thickening and small posteriorly oriented potential gestational sac. Normal appearance of bilateral ovaries. Limited color doppler flow  is suggested in the LEFT ovary which is favored to be related to technical factors based on ovarian position. Trace free fluid in the pelvis. IMPRESSION: Suspected very early intrauterine gestation though quite small and without visible yolk sac or fetal pole at this time. Would treat this as a pregnancy of unknown anatomic location with 7-10 week follow-up with ultrasound and trending of beta HCG. Ultrasound could be performed earlier if symptoms dictate. Strictly speaking, while unlikely, occult ectopic remains a differential consideration close follow-up is therefore recommended. Small subchorionic hemorrhage versus blood in the endometrial canal which is larger than the suspected gestational sac. Normal grayscale appearance of the LEFT ovary with limited color Doppler flow visible, suspect this is related to technical factors. If there are worsening symptoms could consider repeat imaging with spectral Doppler as warranted. Electronically Signed   By: Donzetta KohutGeoffrey  Wile M.D.   On: 07/11/2021 10:34    MAU Course  Procedures Lab Orders         Wet prep, genital         Urinalysis, Routine w reflex microscopic Urine, Clean Catch         CBC  Comprehensive metabolic panel         hCG, quantitative, pregnancy    Meds ordered this encounter  Medications   metroNIDAZOLE (METROGEL VAGINAL) 0.75 % vaginal gel    Sig: Place 1 Applicatorful vaginally at bedtime for 5 days.    Dispense:  70 g    Refill:  0   Imaging Orders         US OB LESS THAN 14 WEEKS WITH OB TRANSVAGINAL     MDM moderate  Assessment and Plan  #Pregnancy of unknown anatomic location #Abdominal pain in pregnancy, first trimester #[redacted] weeks gestation of pregnancy HCG 1.6k, US shows only a probable IUGS. Discussed with patient she likely has normal IUP but will need to do follow up to be sure. Discussed returning to MAU in 48 hours for repeat hcg to ensure appropriate rise, will also schedule for viability scan at [facility] in >2 weeks  so that definitive viability can be determined. Reviewed ectopic precautions in detail including severe morbidity and possible mortality if not treated, patient to return for heavy vaginal bleeding, worsening abdominal pain, or fever. All questions answered, she will return in two days for repeat labs.   #BV Rx sent for metrogel   Dispo: discharged to home in stable condition.    Venora Maples, MD/MPH 07/11/21 11:22 AM  Allergies as of 07/11/2021       Reactions   Bee Pollen    Cottonseed Oil    Mixed Grasses    Other    "Animals."  Red nose, rash, runny nose.        Medication List     TAKE these medications    metroNIDAZOLE 0.75 % vaginal gel Commonly known as: METROGEL VAGINAL Place 1 Applicatorful vaginally at bedtime for 5 days.   valACYclovir 500 MG tablet Commonly known as: VALTREX Take 500 mg by mouth 2 (two) times daily.

## 2021-07-11 NOTE — MAU Note (Signed)
Teresa Bryant is a 31 y.o. at [redacted]w[redacted]d here in MAU reporting: lower abdominal pain mainly on left side, occurs mostly @ night.  Denies VB, but has vaginal discharge.  States thinks she may have BV, gets that " a lot".  LMP: 06/06/2021 Onset of complaint: last Friday Pain score: 7/10 Vitals:   07/11/21 0842  BP: 112/72  Pulse: 60  Resp: 18  Temp: 98.5 F (36.9 C)  SpO2: 99%     FHT:N/A Lab orders placed from triage:   UA

## 2021-07-12 ENCOUNTER — Telehealth: Payer: Self-pay

## 2021-07-12 DIAGNOSIS — O3680X0 Pregnancy with inconclusive fetal viability, not applicable or unspecified: Secondary | ICD-10-CM

## 2021-07-12 MED ORDER — PREPLUS 27-1 MG PO TABS: 1.0000 | ORAL_TABLET | Freq: Every day | ORAL | 6 refills | Status: AC

## 2021-07-12 NOTE — Telephone Encounter (Addendum)
-----   Message from Venora Maples, MD sent at 07/11/2021  5:57 PM EDT ----- Regarding: follow up viability scan Please schedule for viability scan on 07/30/2021 or later.   Thanks [employer] pt that the provider recommended a viability U/S and has been scheduled for 07/30/21 @ 0830.  Pt advised when to go to MAU.  Pt verbalized understanding with no further questions.   Leonette Nutting  07/12/21

## 2021-07-13 ENCOUNTER — Inpatient Hospital Stay (HOSPITAL_COMMUNITY): Payer: Medicaid Other

## 2021-07-13 ENCOUNTER — Inpatient Hospital Stay (HOSPITAL_COMMUNITY)
Admission: AD | Admit: 2021-07-13 | Discharge: 2021-07-13 | Disposition: A | Discharge status: Home / Self Care | Payer: Medicaid Other | Attending: Obstetrics and Gynecology | Admitting: Obstetrics and Gynecology

## 2021-07-13 DIAGNOSIS — O3680X0 Pregnancy with inconclusive fetal viability, not applicable or unspecified: Secondary | ICD-10-CM | POA: Insufficient documentation

## 2021-07-13 DIAGNOSIS — Z3A01 Less than 8 weeks gestation of pregnancy: Secondary | ICD-10-CM | POA: Diagnosis not present

## 2021-07-13 LAB — HCG, QUANTITATIVE, PREGNANCY: hCG, Beta Chain, Quant, S: 3758 m[IU]/mL — ABNORMAL HIGH (ref <5)

## 2021-07-13 NOTE — MAU Provider Note (Signed)
History   Chief Complaint:  Labs Only   Daianna Henandez is  31 y.o. [phone] Patient's last menstrual period was 06/06/2021.Marland Kitchen Patient is here for follow up of quantitative HCG and ongoing surveillance of pregnancy status. She is [redacted]w[redacted]d weeks gestation  by LMP.    Since her last visit, the patient is without new complaint. The patient reports bleeding as  none now.  She denies any pain.  Her previous Quantitative HCG values are:  Component     Latest Ref Rng 07/11/2021  HCG, Beta Chain, Quant, S     <5 mIU/mL 1,620 (H)      Ultrasuond on 6/1 showed possibly tiny IUGS   Physical Exam   Last menstrual period 06/06/2021, unknown if currently breastfeeding.  Physical Examination: General appearance - alert, well appearing, and in no distress Mental status - normal mood, behavior, speech, dress, motor activity, and thought processes   Labs: Results for orders placed or performed during the hospital encounter of 07/13/21 (from the past 24 hour(s))  hCG, quantitative, pregnancy   Collection Time: 07/13/21  8:17 AM  Result Value Ref Range   hCG, Beta Chain, Quant, S 3,758 (H) <5 mIU/mL    Ultrasound Studies:   No results found.  Assessment:   1. Pregnancy of unknown anatomic location     -Appropriate rise in [facility] from 1620 to 3758. Scheduled for viability scan on 6/20   Plan: -Discharge home in stable condition -SAB & ectopic precautions discussed -Patient advised to follow-up with the office for scheduled ultrasound -Patient may return to MAU as needed or if her condition were to change or worsen  Judeth Horn, NP 07/13/2021, 10:29 AM

## 2021-07-13 NOTE — MAU Note (Signed)
Pt called, not in lobby x3 

## 2021-07-13 NOTE — MAU Note (Signed)
Pt called, not in lobby 

## 2021-07-13 NOTE — Discharge Instructions (Signed)
Return to care  If you have heavier bleeding that soaks through more than 2 pads per hour for an hour or more If you bleed so much that you feel like you might pass out or you do pass out If you have significant abdominal pain that is not improved with Tylenol   

## 2021-07-13 NOTE — MAU Note (Signed)
Pt called, not in lobby x2 

## 2021-07-22 ENCOUNTER — Other Ambulatory Visit: Payer: Self-pay

## 2021-07-22 ENCOUNTER — Inpatient Hospital Stay (HOSPITAL_COMMUNITY): Payer: Medicaid Other

## 2021-07-22 ENCOUNTER — Encounter (HOSPITAL_COMMUNITY): Payer: Self-pay | Admitting: Obstetrics & Gynecology

## 2021-07-22 ENCOUNTER — Inpatient Hospital Stay (HOSPITAL_COMMUNITY)
Admission: AD | Admit: 2021-07-22 | Discharge: 2021-07-22 | Disposition: A | Discharge status: Home / Self Care | Payer: Medicaid Other | Attending: Family Medicine | Admitting: Family Medicine

## 2021-07-22 DIAGNOSIS — O26891 Other specified pregnancy related conditions, first trimester: Secondary | ICD-10-CM | POA: Insufficient documentation

## 2021-07-22 DIAGNOSIS — O208 Other hemorrhage in early pregnancy: Secondary | ICD-10-CM | POA: Diagnosis present

## 2021-07-22 DIAGNOSIS — O418X30 Other specified disorders of amniotic fluid and membranes, third trimester, not applicable or unspecified: Secondary | ICD-10-CM

## 2021-07-22 DIAGNOSIS — O99891 Other specified diseases and conditions complicating pregnancy: Secondary | ICD-10-CM | POA: Insufficient documentation

## 2021-07-22 DIAGNOSIS — M549 Dorsalgia, unspecified: Secondary | ICD-10-CM | POA: Diagnosis not present

## 2021-07-22 DIAGNOSIS — Z3A01 Less than 8 weeks gestation of pregnancy: Secondary | ICD-10-CM | POA: Diagnosis not present

## 2021-07-22 DIAGNOSIS — R1012 Left upper quadrant pain: Secondary | ICD-10-CM | POA: Diagnosis not present

## 2021-07-22 LAB — URINALYSIS, ROUTINE W REFLEX MICROSCOPIC
Bilirubin Urine: NEGATIVE
Glucose, UA: NEGATIVE mg/dL
Ketones, ur: NEGATIVE mg/dL
Leukocytes,Ua: NEGATIVE
Nitrite: NEGATIVE
Protein, ur: NEGATIVE mg/dL
Specific Gravity, Urine: 1.018 (ref 1.005–1.030)
pH: 6 (ref 5.0–8.0)

## 2021-07-22 LAB — WET PREP, GENITAL
Sperm: NONE SEEN
Trich, Wet Prep: NONE SEEN
WBC, Wet Prep HPF POC: >=10 — AB (ref <10)
Yeast Wet Prep HPF POC: NONE SEEN

## 2021-07-22 NOTE — MAU Provider Note (Signed)
History     CSN: [id number]  Arrival date and time: 07/22/21 0725    Chief Complaint  Patient presents with   Vaginal Bleeding   Abdominal Pain   Back Pain   HPI This is a 31 year old G8 P1-0-6-1 at 6 weeks and 4 days who presents with vaginal spotting after intercourse last night.  She woke up and had multiple bleeding with wiping.  She also passed a very small piece of pink tissue.  She was also treated for BV last time she was here.  She was treated with MetroGel.  She feels like she still has vaginal odor after completing the vaginal antibiotics 2 days ago.  She has been constipated leading a with pain in her left upper quadrant.  OB History     Gravida  8   Para  1   Term  1   Preterm      AB  6   Living  1      SAB      IAB  6   Ectopic      Multiple      Live Births  1           Past Medical History:  Diagnosis Date   GERD (gastroesophageal reflux disease)    Headache    Lactose intolerance    Ovarian cyst     Past Surgical History:  Procedure Laterality Date   INDUCED ABORTION      Family History  Problem Relation Age of Onset   Healthy Mother    Healthy Father     Social History   Tobacco Use   Smoking status: Never   Smokeless tobacco: Never  Vaping Use   Vaping Use: Never used  Substance Use Topics   Alcohol use: No   Drug use: No    Allergies:  Allergies  Allergen Reactions   Bee Pollen    Cottonseed Oil    Mixed Grasses    Other     "Animals."  Red nose, rash, runny nose.    Medications Prior to Admission  Medication Sig Dispense Refill Last Dose   Prenatal Vit-Fe Fumarate-FA (PREPLUS) 27-1 MG TABS Take 1 tablet by mouth daily. 30 tablet 6    valACYclovir (VALTREX) 500 MG tablet Take 500 mg by mouth 2 (two) times daily.       Review of Systems Physical Exam   Blood pressure 112/66, pulse 62, temperature 99.5 F (37.5 C), temperature source Oral, resp. rate 16, height 5' (1.524 m), weight 77.8 kg,  last menstrual period 06/06/2021, SpO2 100 %, unknown if currently breastfeeding.  Physical Exam Vitals reviewed. Exam conducted with a chaperone present.  Constitutional:      Appearance: She is well-developed.  Abdominal:     Tenderness: There is abdominal tenderness in the left upper quadrant. There is no guarding or rebound.     Hernia: There is no hernia in the left inguinal area or right inguinal area.  Genitourinary:    Labia:        Right: No rash or tenderness.        Left: No rash or tenderness.      Urethra: No urethral lesion.  Lymphadenopathy:     Lower Body: No right inguinal adenopathy. No left inguinal adenopathy.  Skin:    Coloration: Skin is not cyanotic or mottled.  Neurological:     Mental Status: She is alert.    Results for orders placed or performed during the hospital  encounter of 07/22/21 (from the past 24 hour(s))  Urinalysis, Routine w reflex microscopic Urine, Clean Catch     Status: Abnormal   Collection Time: 07/22/21  7:45 AM  Result Value Ref Range   Color, Urine YELLOW YELLOW   APPearance HAZY (A) CLEAR   Specific Gravity, Urine 1.018 1.005 - 1.030   pH 6.0 5.0 - 8.0   Glucose, UA NEGATIVE NEGATIVE mg/dL   Hgb urine dipstick MODERATE (A) NEGATIVE   Bilirubin Urine NEGATIVE NEGATIVE   Ketones, ur NEGATIVE NEGATIVE mg/dL   Protein, ur NEGATIVE NEGATIVE mg/dL   Nitrite NEGATIVE NEGATIVE   Leukocytes,Ua NEGATIVE NEGATIVE   RBC / HPF 6-10 0 - 5 RBC/hpf   WBC, UA 0-5 0 - 5 WBC/hpf   Bacteria, UA RARE (A) NONE SEEN   Squamous Epithelial / LPF 0-5 0 - 5   Mucus PRESENT   Wet prep, genital     Status: Abnormal   Collection Time: 07/22/21  8:18 AM  Result Value Ref Range   Yeast Wet Prep HPF POC NONE SEEN NONE SEEN   Trich, Wet Prep NONE SEEN NONE SEEN   Clue Cells Wet Prep HPF POC PRESENT (A) NONE SEEN   WBC, Wet Prep HPF POC >=10 (A) <10   Sperm NONE SEEN    US OB Transvaginal  Result Date: 07/22/2021 CLINICAL DATA:  31 year old pregnant  female. Vaginal bleeding and pain for 1 day. On 07/13/2021 quantitative beta hCG 3,758. Last menstrual period 06/06/2021. EXAM: OBSTETRIC <14 WK [place] AND TRANSVAGINAL OB US TECHNIQUE: Both transabdominal and transvaginal ultrasound examinations were performed for complete evaluation of the gestation as well as the maternal uterus, adnexal regions, and pelvic cul-de-sac. Transvaginal technique was performed to assess early pregnancy. COMPARISON:  Prior early OB ultrasound 07/11/2021 FINDINGS: Intrauterine gestational sac: There is a larger anechoic endometrial canal likely gestational sac with surrounding decidual reaction. Yolk sac:  Not Visualized. Embryo:  Not Visualized. Cardiac Activity: Not applicable MSD: 5.1 mm   5 w   2 d Subchorionic hemorrhage: Moderate hypoechoic likely subchorionic hemorrhage appears mildly increased in size compared to 07/11/2021 but was not measured by the sonographer. Maternal uterus/adnexae: The bilateral maternal adnexae appear within normal limits. No definitive ectopic pregnancy is visualized. IMPRESSION: 1. Compared to 07/11/2021, there is mild interval enlargement of the prior presumed gestational sac, now corresponding to 5 weeks 2 days estimated gestational age. Again no fetal pole is identified. Differential considerations include early pregnancy, failed pregnancy, or (less likely) "pseudogestational sac" and ectopic pregnancy. Recommend continued serial clinical exams and serum beta hCGs with possible follow-up ultrasound. 2. Interval increase in likely moderate subchorionic hematoma. Electronically Signed   By: Neita Garnet M.D.   On: 07/22/2021 10:45     MAU Course  Procedures  MDM  Assessment and Plan   1. [redacted] weeks gestation of pregnancy   2. Subchorionic hematoma in third trimester, single or unspecified fetus    Discussed results with patient.  Patient's has follow-up ultrasound already scheduled.  Patient to return with any acute change. Pelvic rest for 2  weeks.   Levie Heritage 07/22/2021, 10:50 AM

## 2021-07-22 NOTE — MAU Note (Signed)
Teresa Bryant is a 31 y.o. at [redacted]w[redacted]d here in MAU reporting: started seeing blood around Midnight, only when she wipes. Started after intercourse Feeling pain in her stomach - mid around to back.  Woke up hurting Onset of complaint: 0030 Pain score: 7 Vitals:   07/22/21 0739  BP: 112/66  Pulse: 62  Resp: 16  Temp: 99.5 F (37.5 C)  SpO2: 100%     Lab orders placed from triage:  urine

## 2021-07-30 ENCOUNTER — Other Ambulatory Visit: Payer: Medicaid Other | Admitting: Advanced Practice Midwife

## 2021-07-30 ENCOUNTER — Ambulatory Visit (INDEPENDENT_AMBULATORY_CARE_PROVIDER_SITE_OTHER): Payer: Medicaid Other | Admitting: Advanced Practice Midwife

## 2021-07-30 ENCOUNTER — Ambulatory Visit (INDEPENDENT_AMBULATORY_CARE_PROVIDER_SITE_OTHER): Payer: Medicaid Other

## 2021-07-30 ENCOUNTER — Encounter: Payer: Self-pay | Admitting: General Practice

## 2021-07-30 DIAGNOSIS — O039 Complete or unspecified spontaneous abortion without complication: Secondary | ICD-10-CM | POA: Insufficient documentation

## 2021-07-30 DIAGNOSIS — O034 Incomplete spontaneous abortion without complication: Secondary | ICD-10-CM | POA: Diagnosis not present

## 2021-07-30 DIAGNOSIS — F4321 Adjustment disorder with depressed mood: Secondary | ICD-10-CM

## 2021-07-30 DIAGNOSIS — O3680X0 Pregnancy with inconclusive fetal viability, not applicable or unspecified: Secondary | ICD-10-CM

## 2021-07-30 MED ORDER — PROMETHAZINE HCL 25 MG PO TABS: 25.0000 mg | ORAL_TABLET | Freq: Four times a day (QID) | ORAL | 0 refills | Status: DC | PRN

## 2021-07-30 MED ORDER — TRAMADOL HCL 50 MG PO TABS: 50.0000 mg | ORAL_TABLET | Freq: Four times a day (QID) | ORAL | 0 refills | Status: DC | PRN

## 2021-07-30 MED ORDER — MISOPROSTOL 200 MCG PO TABS: 800.0000 ug | ORAL_TABLET | ORAL | 1 refills | Status: DC

## 2021-07-30 NOTE — Progress Notes (Signed)
History    Chief Complaint:  F/U US   Teresa Bryant is  31 y.o. [id number] Patient's last menstrual period was 06/06/2021.Marland Kitchen Patient is here for follow up of [place] and ongoing surveillance of pregnancy status.   She is [redacted]w[redacted]d weeks gestation  by LMP.    Since her last visit, the patient is without new complaint.     ROS Abdominal Pain: Denies Vaginal bleeding: None.   Passage of clots or tissue: Denies  O POS  Her previous Quantitative HCG values are:   Latest Reference Range & Units 07/11/21 09:31 07/13/21 08:17  HCG, Beta Chain, Quant, S <5 mIU/mL 1,620 (H) 3,758 (H)    Previsous [place]   07/11/21: [place] 2.5 mm GS, no YS, no FP 07/22/21: [place] 5.1 mm GS, no YS, no FP Physical Exam   LMP 06/06/2021   Constitutional: Well-nourished female in no apparent distress. No pallor Neuro: Alert and oriented 4 Cardiovascular: Normal rate Respiratory: Normal effort and rate Abdomen: Soft, nontender Gynecological Exam: Deferred  Labs: No results found for this or any previous visit (from the past 24 hour(s)).  Ultrasound Studies:   US OB Transvaginal  Result Date: 07/30/2021 CLINICAL DATA:  Viability Exam: OBSTETRIC <14 WK [place] and TRANSVAGINAL OB US Technique:  Transvaginal ultrasound examination was performed for complete evaluation of the gestation as well at the maternal uterus, adnexal regions, and pelvic cul-de-sac.  Transvaginal technique was performed to assess early pregnancy. Comparison:  07/22/21 Findings: Gestational Sac: 5 mm. MGSD Yolk sac: No Embryo:  No Cardiac activity: No Cervix: nl Adnexa: nl Subchorionic hemorrhage:  yes Impression: U/S findings are consistent with failed pregnancy. Recommendations: Clinical correlation recommended     MDM: US shows 5 mm GS, no YS no FP. No progression since 07/22/21. Meets AIUM criteria for failed pregnancy.   Discussed options for management of incomplete AB including expectant management, Cytotec or D&C. Prefers Cytotec at this time. Verbalizes  understanding that intervention may become necessary if SAB is not completed with Cytotec or if heavy bleeding or infection occur.    Assessment: 1. Incomplete miscarriage     Plan: Rx Cytotec Support given F/U 2 weeks for SAB F/U, HCG and IBH  Allergies as of 07/30/2021       Reactions   Bee Pollen    Cottonseed Oil    Mixed Grasses    Other    "Animals."  Red nose, rash, runny nose.        Medication List        Accurate as of July 30, 2021  9:53 PM. If you have any questions, ask your nurse or doctor.          misoprostol 200 MCG tablet Commonly known as: CYTOTEC Place 4 tablets (800 mcg total) vaginally daily. Place four tablets in between your gums and cheeks (two tablets on each side) as instructed OR insert four tablets vaginally. If you do not pass pregnancy tissue within 24 hours, take second dose. If you do not pass pregnancy tissue within 24 hours after 2nd dose call the office at [phone]. Started by: Dorathy Kinsman, CNM   PrePLUS 27-1 MG Tabs Take 1 tablet by mouth daily.   promethazine 25 MG tablet Commonly known as: PHENERGAN Take 1 tablet (25 mg total) by mouth every 6 (six) hours as needed for nausea or vomiting. Started by: Dorathy Kinsman, CNM   traMADol 50 MG tablet Commonly known as: Ultram Take 1-2 tablets (50-100 mg total) by mouth every 6 (six)  hours as needed for severe pain. Started by: Dorathy Kinsman, CNM   valACYclovir 500 MG tablet Commonly known as: VALTREX Take 500 mg by mouth 2 (two) times daily.        Katrinka Blazing, [place], CNM 07/30/2021, 11:12 AM  2/3

## 2021-07-31 ENCOUNTER — Encounter: Payer: Self-pay | Admitting: Family Medicine

## 2021-08-05 ENCOUNTER — Telehealth: Payer: Self-pay | Admitting: Clinical

## 2021-08-14 ENCOUNTER — Ambulatory Visit (INDEPENDENT_AMBULATORY_CARE_PROVIDER_SITE_OTHER): Payer: Medicaid Other | Admitting: General Practice

## 2021-08-14 ENCOUNTER — Encounter: Payer: Self-pay | Admitting: Family Medicine

## 2021-08-14 ENCOUNTER — Other Ambulatory Visit: Payer: Self-pay

## 2021-08-14 DIAGNOSIS — O034 Incomplete spontaneous abortion without complication: Secondary | ICD-10-CM

## 2021-08-15 ENCOUNTER — Other Ambulatory Visit: Payer: Self-pay

## 2021-08-15 ENCOUNTER — Telehealth: Payer: Self-pay

## 2021-08-15 LAB — BETA HCG QUANT (REF LAB): hCG Quant: 7 m[IU]/mL

## 2021-08-15 MED ORDER — METRONIDAZOLE 500 MG PO TABS: 500.0000 mg | ORAL_TABLET | Freq: Two times a day (BID) | ORAL | 0 refills | Status: DC

## 2021-08-15 NOTE — Telephone Encounter (Addendum)
-----   Message from Warden Fillers, MD sent at 08/15/2021  8:25 AM EDT ----- Bhcg almost normal, recheck in 2 weeks  Left message for pt to return call about results and f/u.   Leonette Nutting  08/15/21

## 2021-08-15 NOTE — Telephone Encounter (Signed)
Called pt was informed of advisement.  Pt states that she has an appt on 08/26/21.  I explained to the pt that she can get the lab drawn that day.  Pt also requests that she had BV when she went to [facility]U on 07/27/21 and no medication was sent.  Pt states that she still has the symptoms.  Flagyl e-prescribed per standing protocol.   Leonette Nutting  08/15/21

## 2021-08-26 ENCOUNTER — Encounter: Payer: Self-pay | Admitting: Obstetrics and Gynecology

## 2021-08-26 ENCOUNTER — Ambulatory Visit (INDEPENDENT_AMBULATORY_CARE_PROVIDER_SITE_OTHER): Payer: Medicaid Other | Admitting: Obstetrics and Gynecology

## 2021-08-26 ENCOUNTER — Other Ambulatory Visit: Payer: Self-pay

## 2021-08-26 VITALS — BP 123/78 | HR 67 | Ht 63.0 in | Wt 166.4 lb

## 2021-08-26 DIAGNOSIS — O039 Complete or unspecified spontaneous abortion without complication: Secondary | ICD-10-CM | POA: Diagnosis not present

## 2021-08-26 NOTE — Progress Notes (Unsigned)
Pt reported some Pressure in lower abdomen.

## 2021-08-27 NOTE — Progress Notes (Signed)
  CC:  miscarriage follow up Subjective:    Patient ID: Chemical engineer, female    DOB: March 15, 1990, 31 y.o.   MRN: [id number]  HPI Pt seen for miscarriage follow up.  Pt initially seen 07/13/21.  She had serial visits and ultrasounds which showed a small gestational sac that over time had no progression.  Pt was offered cytotec.  She did not experience major bleeding, but bhcg levels have now returned to normal.   Review of Systems     Objective:   Physical Exam Vitals:   08/26/21 1444  BP: 123/78  Pulse: 67         Assessment & Plan:   1. Miscarriage Medical management successful. No further bhcg. Follow up as needed.  I spent 10 minutes dedicated to the care of this patient including previsit review of records, face to face time with the patient discussing treatment outcome and post visit testing.      Warden Fillers, MD Faculty Attending, [facility]

## 2021-10-28 NOTE — Progress Notes (Signed)
Pt was seen by lab- not nurse or provider.

## 2022-02-19 IMAGING — US US OB < 14 WEEKS - US OB TV
1 series · 15 of 28 positions shown · non-contrast
Comparison: None.

CLINICAL DATA: Vaginal bleeding.

EXAM:
OBSTETRIC <14 WK US AND TRANSVAGINAL OB US
TECHNIQUE: Both transabdominal and transvaginal ultrasound examinations were
performed for complete evaluation of the gestation as well as the
maternal uterus, adnexal regions, and pelvic cul-de-sac.
Transvaginal technique was performed to assess early pregnancy.

[Series 1: us ob < 14 weeks - us ob tv · 15 of 60 slices shown]
[im 1/60]
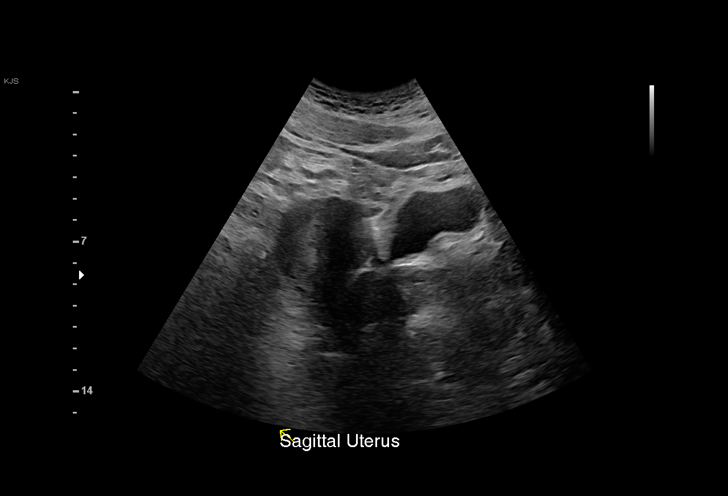
[im 5/60]
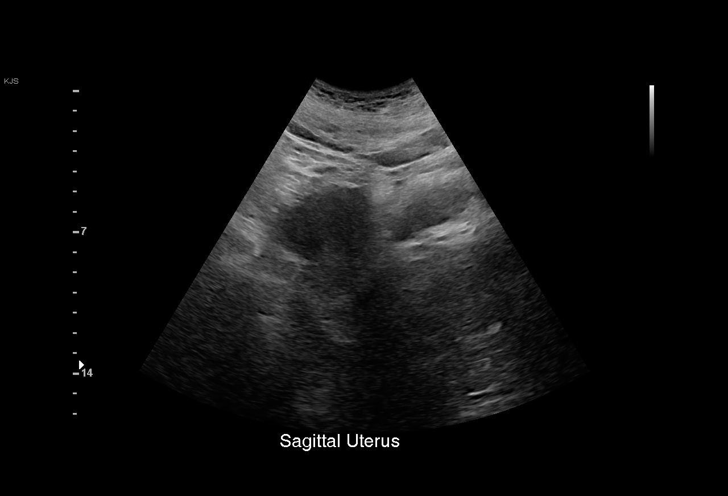
[im 9/60]
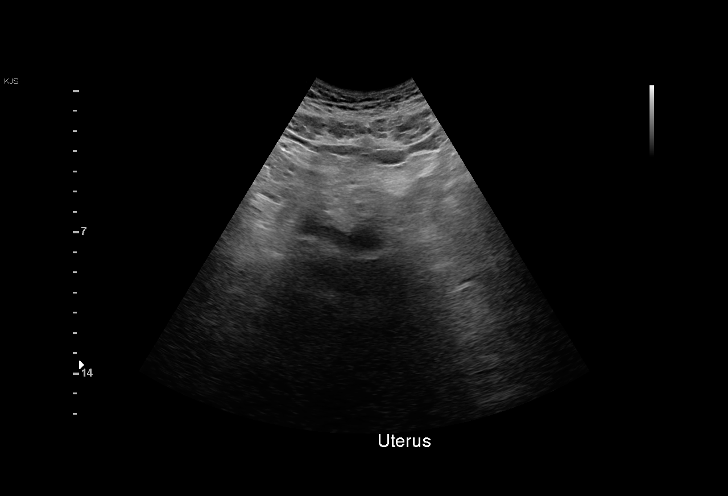
[im 14/60]
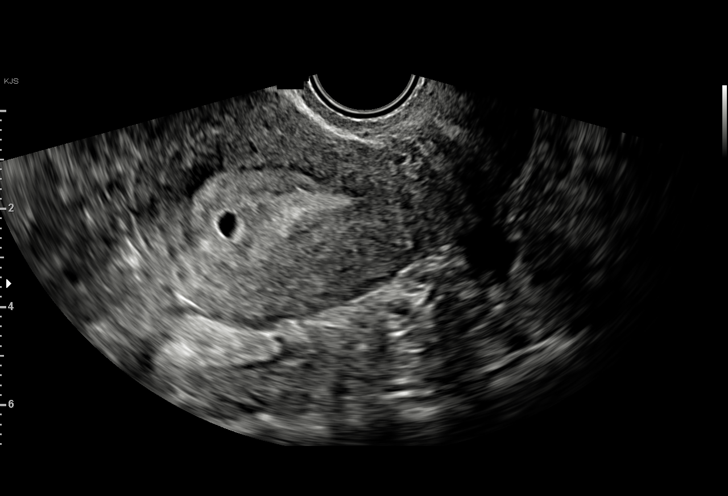
[im 18/60]
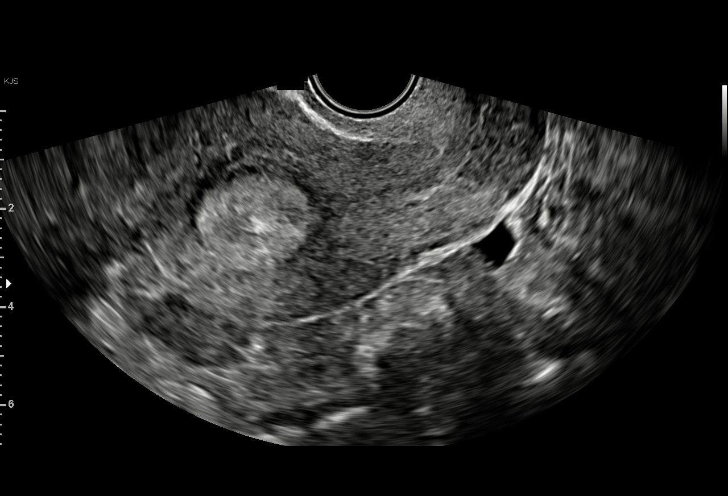
[im 22/60]
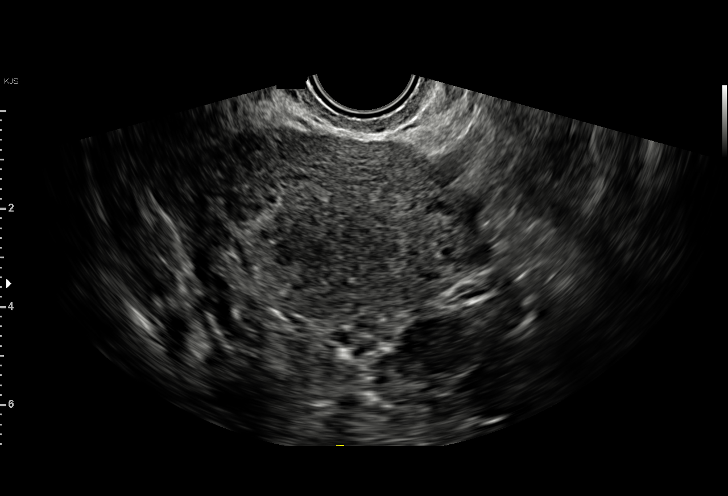
[im 27/60]
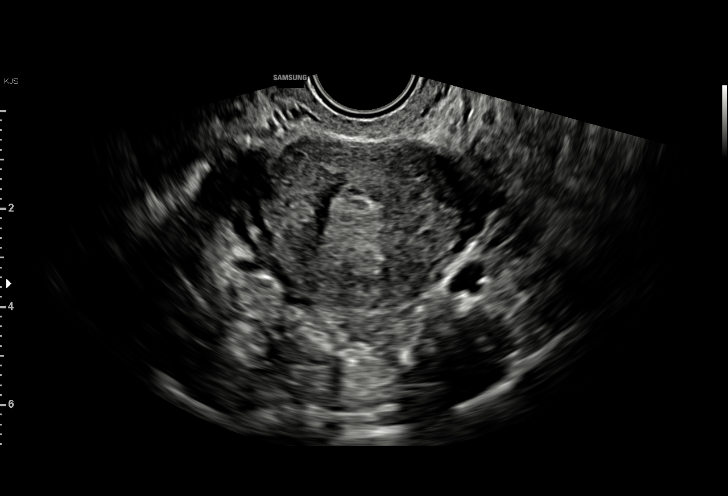
[im 31/60]
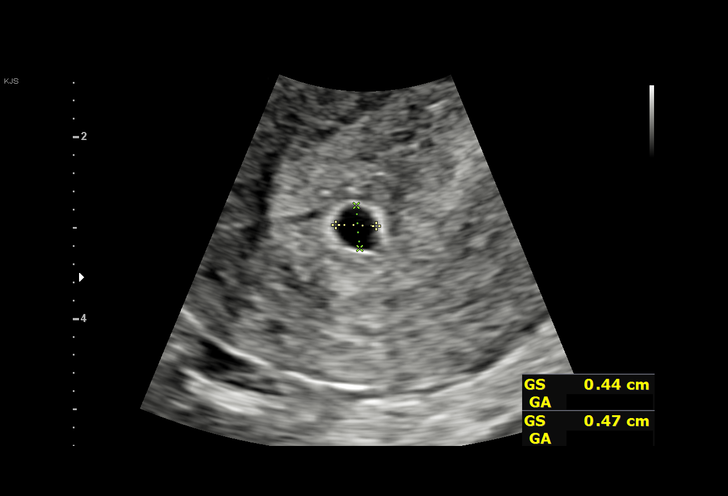
[im 33/60]
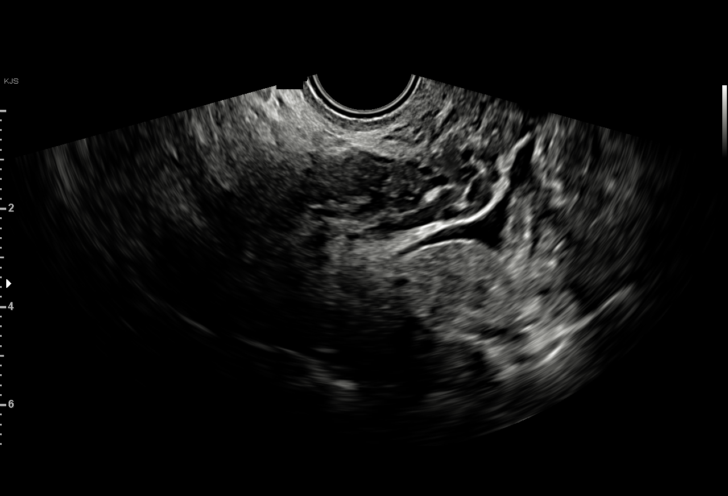
[im 38/60]
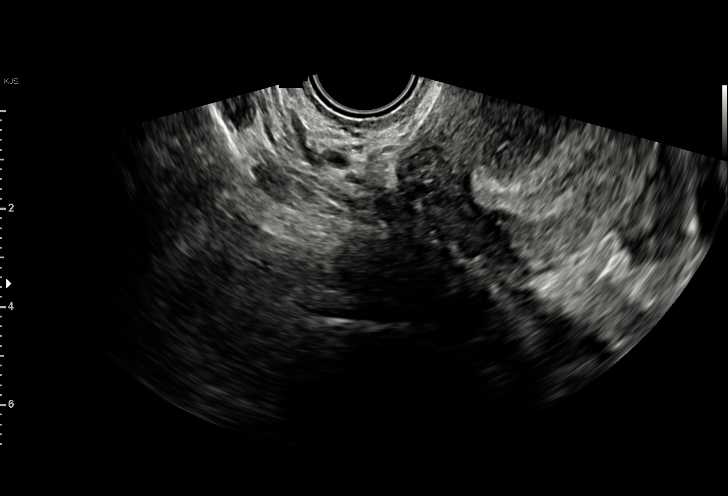
[im 42/60]
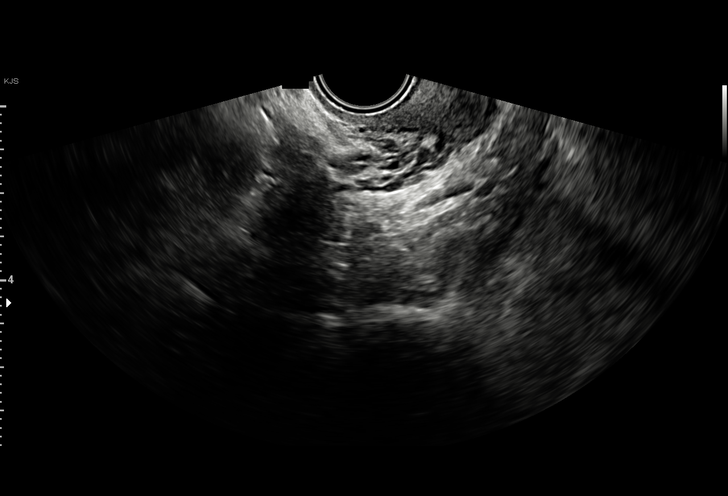
[im 46/60]
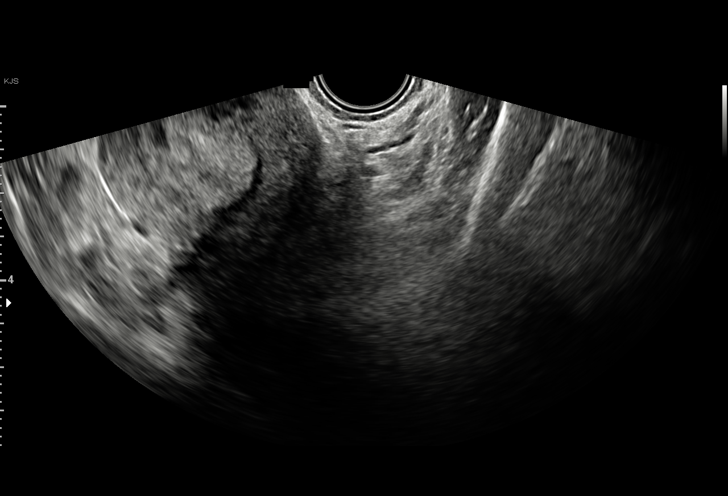
[im 51/60]
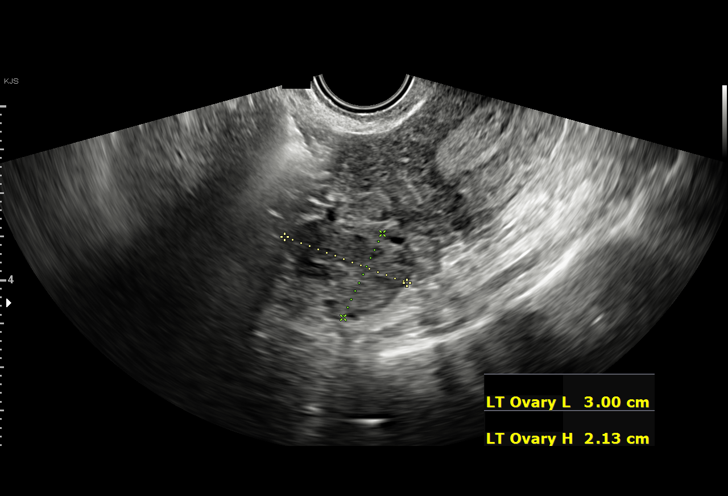
[im 55/60]
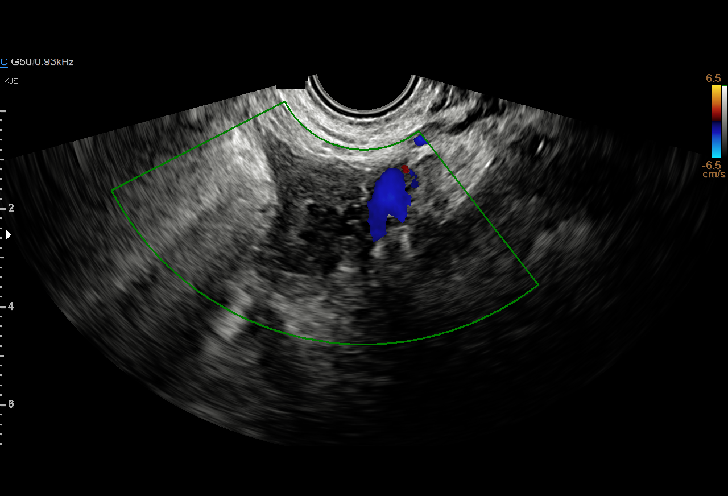
[im 60/60]
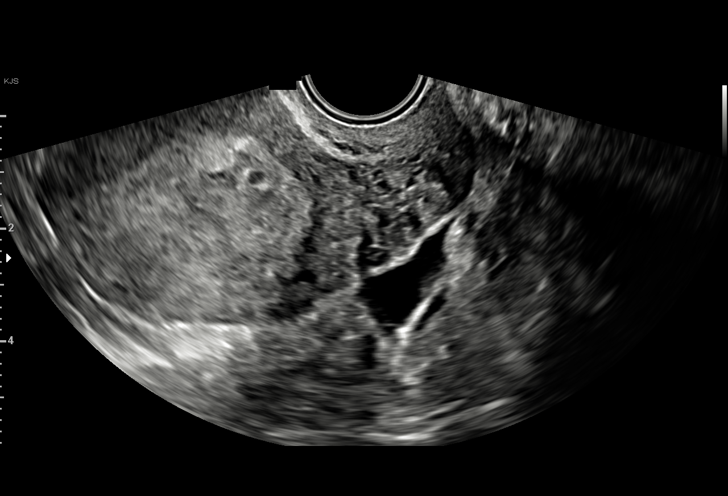

[15 of 28 positions shown; findings below may reference images not displayed]

FINDINGS: Intrauterine gestational sac: Single

Yolk sac:  None

Embryo:  None

Cardiac Activity: None

MSD: 4.4 mm   5 w   1 d

Subchorionic hemorrhage:  None visualized.

Maternal uterus/adnexae:

Right ovary: Normal

Left ovary: Normal

Other :None

Free fluid:  Small
IMPRESSION: 1. Probable early intrauterine gestational sac, but no yolk sac,
fetal pole, or cardiac activity yet visualized. Recommend follow-up
quantitative B-HCG levels and follow-up US in 14 days to confirm and
assess viability. This recommendation follows SRU consensus
guidelines: Diagnostic Criteria for Nonviable Pregnancy Early in the
First Trimester. N Engl J Med 6449; [DATE].

## 2022-03-13 IMAGING — US US OB TRANSVAGINAL
1 series · 15 of 28 positions shown · non-contrast
Comparison: None.

CLINICAL DATA: Viability

EXAM:
TRANSVAGINAL OB ULTRASOUND
TECHNIQUE: Transvaginal ultrasound was performed for complete evaluation of the
gestation as well as the maternal uterus, adnexal regions, and
pelvic cul-de-sac.

[Series 1: us ob transvaginal · 45 acquisitions, 15 frames shown]
[im 1/45]
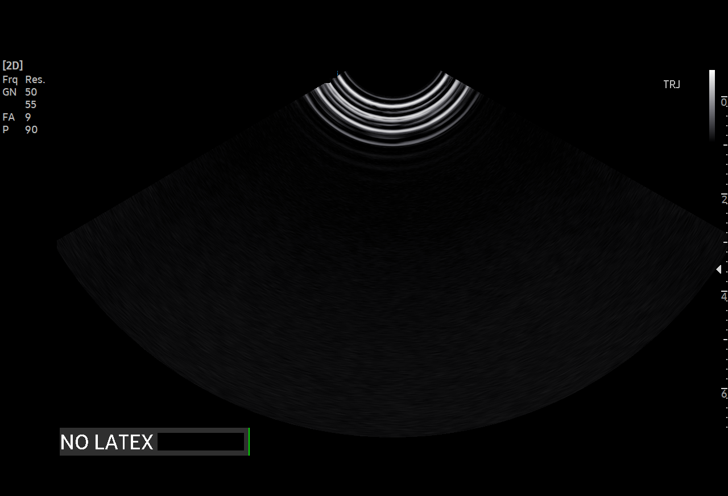
[im 4/45]
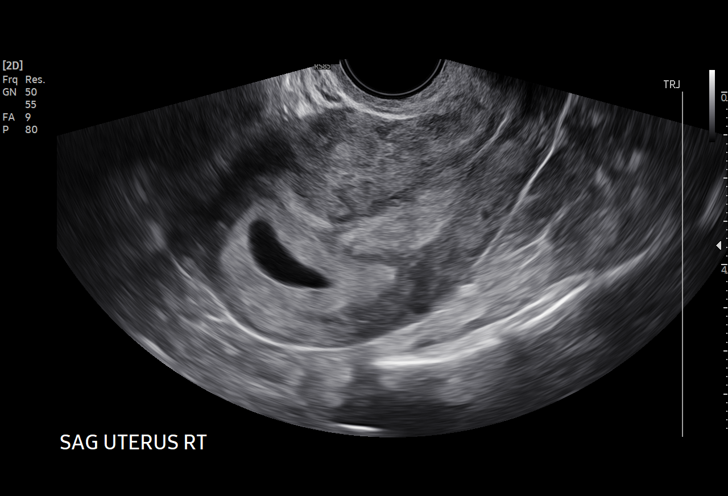
[im 7/45]
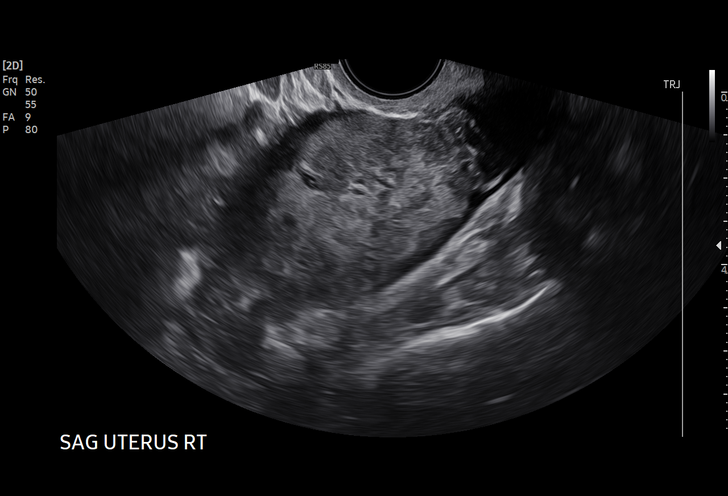
[im 10/45]
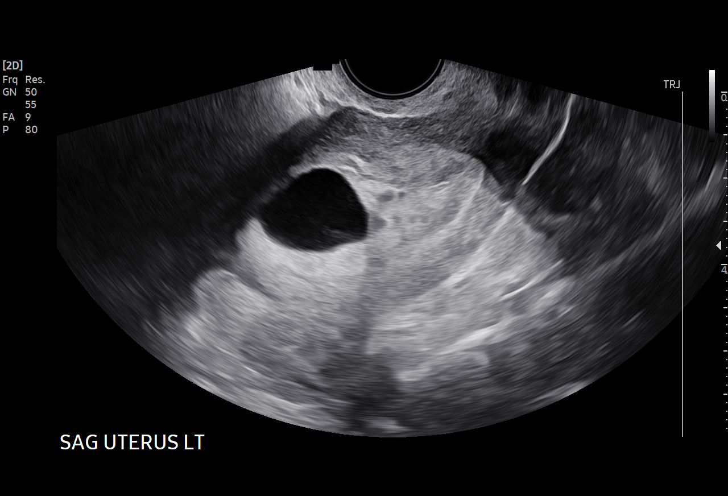
[im 14/45]
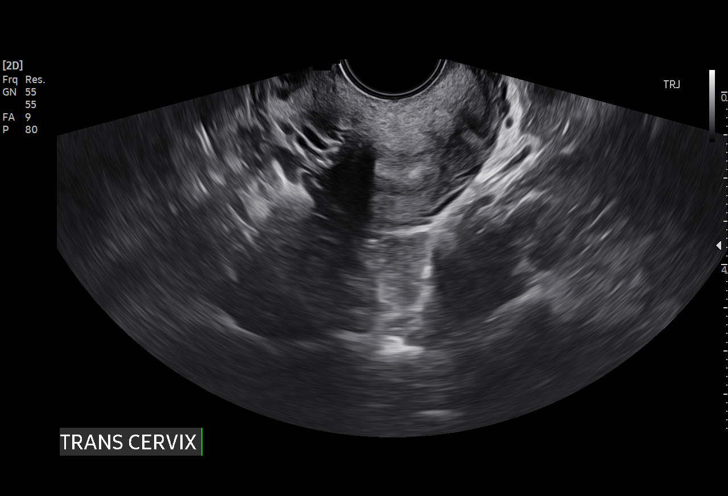
[im 17/45]
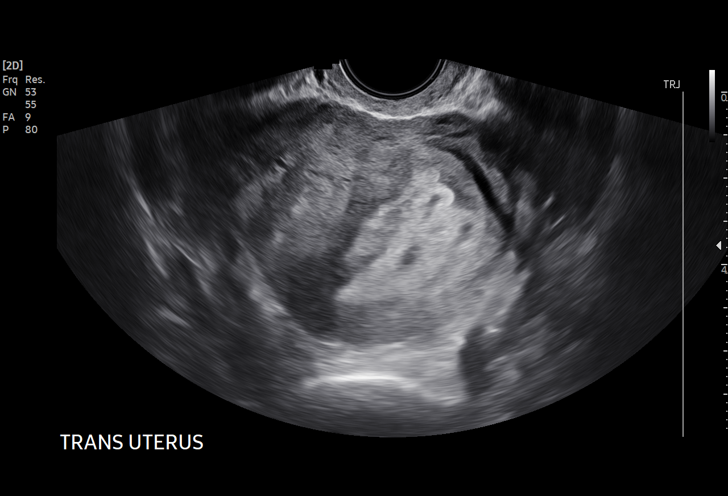
[im 20/45]
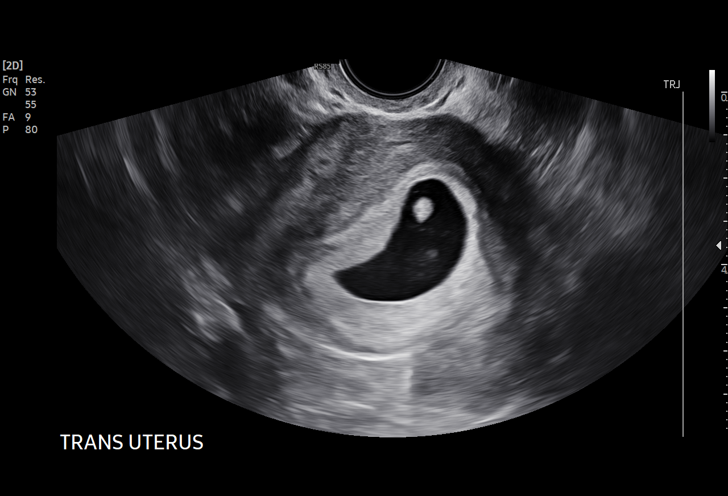
[im 23/45]
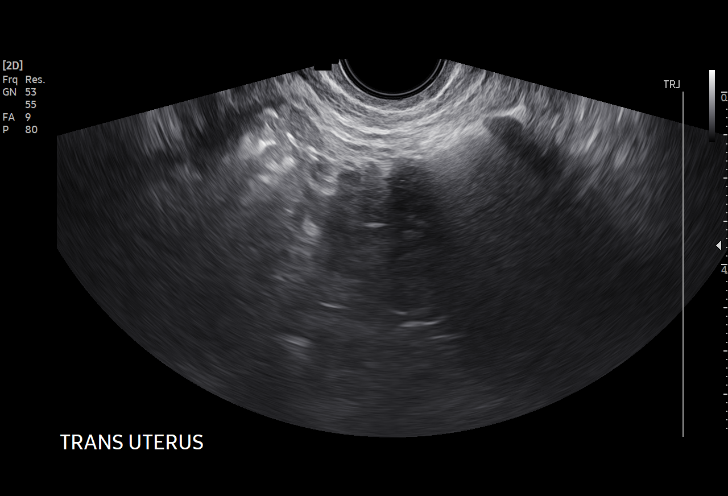
[im 25/45]
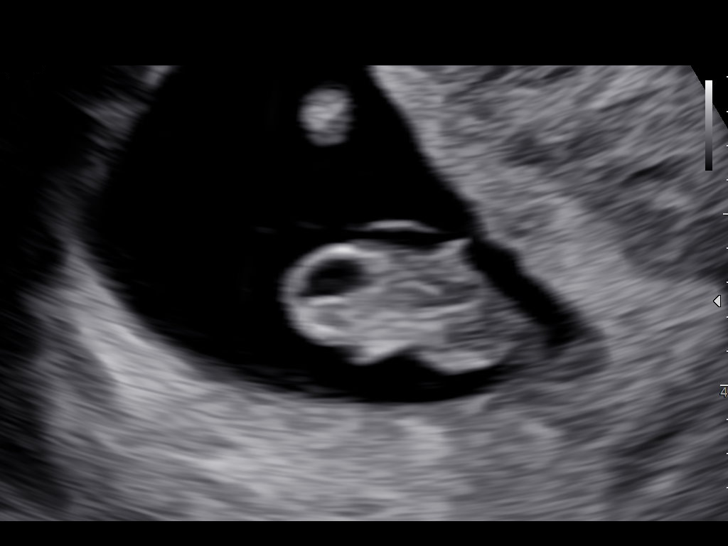
[im 28/45]
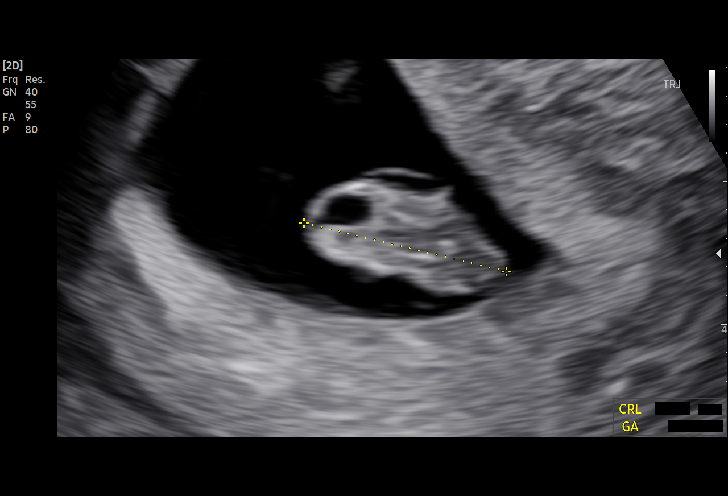
[im 31/45]
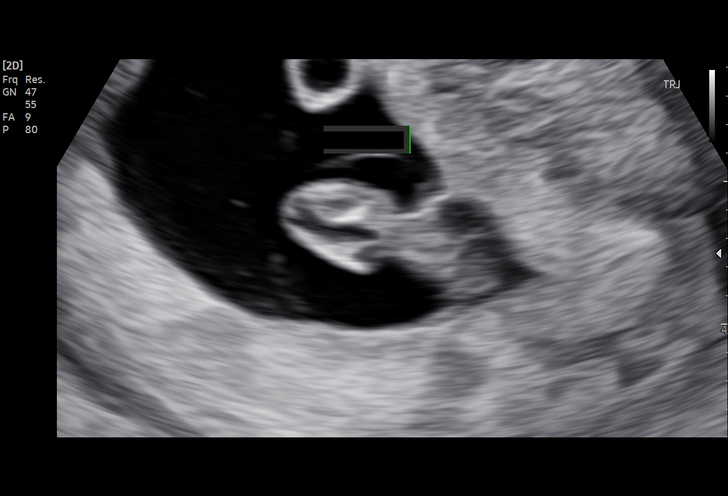
[im 35/45]
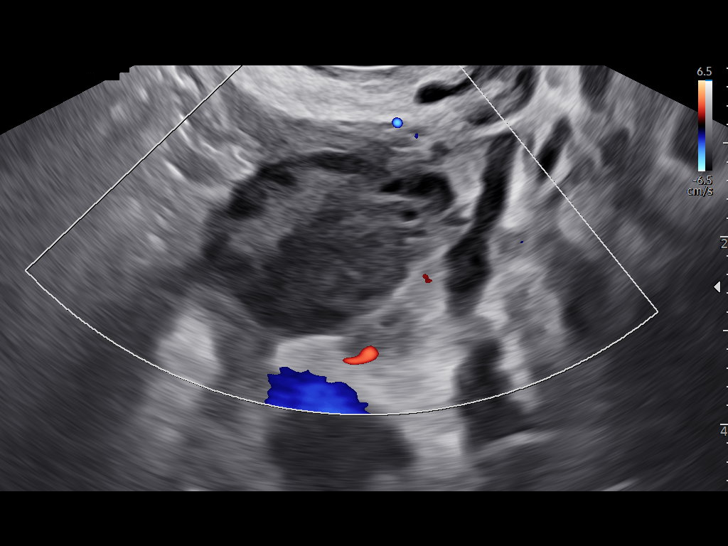
[im 38/45]
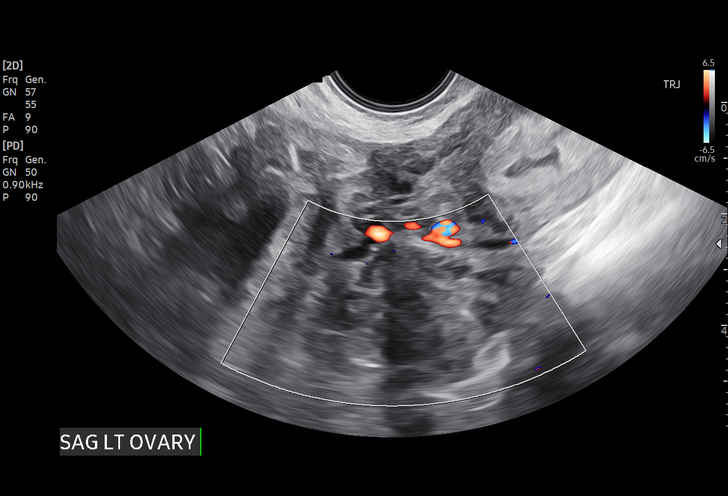
[im 41/45]
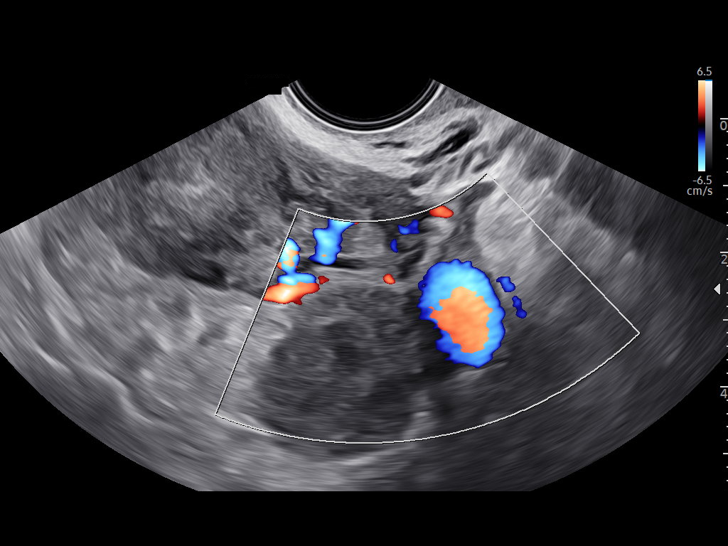
[im 45/45]
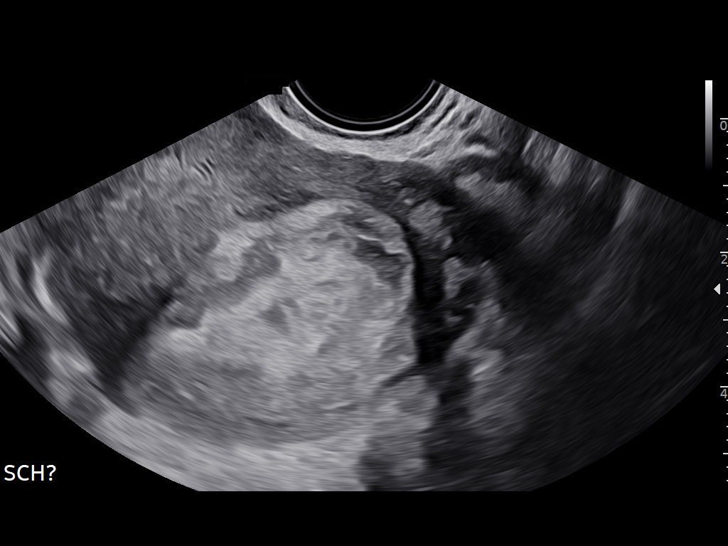

[15 of 28 positions shown; findings below may reference images not displayed]

FINDINGS: Intrauterine gestational sac: Single

Yolk sac:  Visualized.

Embryo:  Visualized.

Cardiac Activity: Visualized.

Heart Rate: 157 bpm

MSD:   mm    w     d

CRL:   15 mm   7 w 6 d                  US EDC: 06/06/2021

Subchorionic hemorrhage:  Small subchorionic hemorrhage

Maternal uterus/adnexae: No adnexal mass or free fluid.
IMPRESSION: Seven week 6 day intrauterine pregnancy. Fetal heart rate 157 beats
per minute. No acute maternal findings.

## 2022-11-28 IMAGING — US US OB < 14 WEEKS - US OB TV
1 series · 14 of 28 positions shown · non-contrast
Comparison: Prior imaging from Sunday October, 2020.

CLINICAL DATA: A 30-year-old female presents for evaluation of
intermittent abdominal and pelvic pain in the setting of positive
pregnancy test. No current quantitative beta HCG is available. Last
menstrual cycle would estimate gestational age of 5 weeks and 0
days.

EXAM:
OBSTETRIC <14 WK US AND TRANSVAGINAL OB US
TECHNIQUE: Both transabdominal and transvaginal ultrasound examinations were
performed for complete evaluation of the gestation as well as the
maternal uterus, adnexal regions, and pelvic cul-de-sac.
Transvaginal technique was performed to assess early pregnancy.

[Series 1: us ob < 14 weeks - us ob tv · 79 acquisitions, 14 frames shown]
[im 3/79]
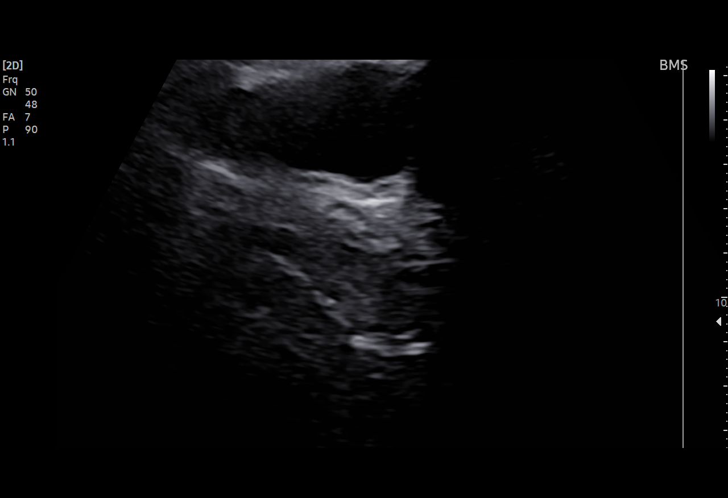
[im 9/79]
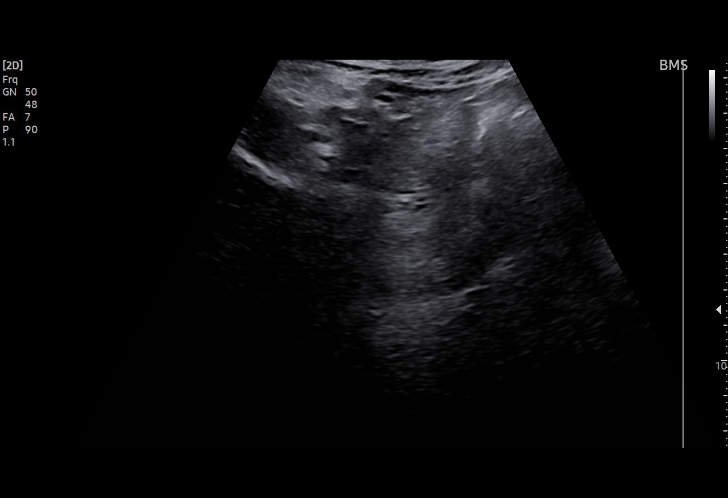
[im 15/79]
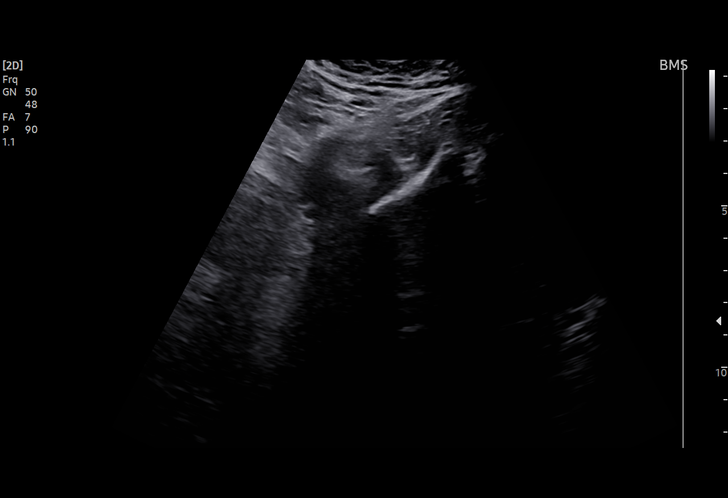
[im 21/79]
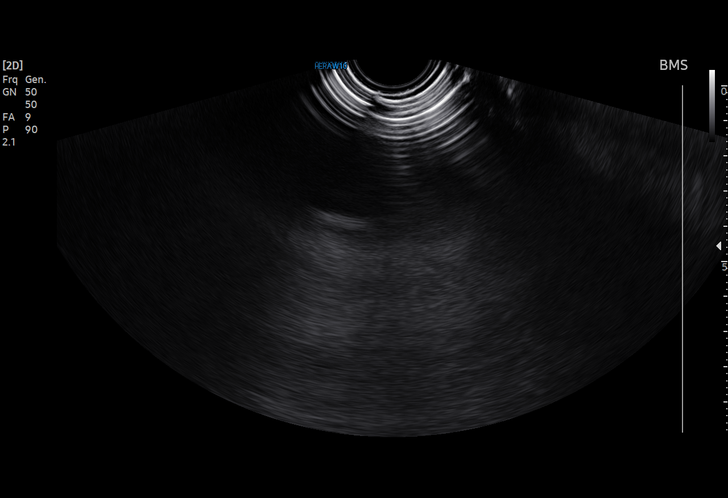
[im 27/79]
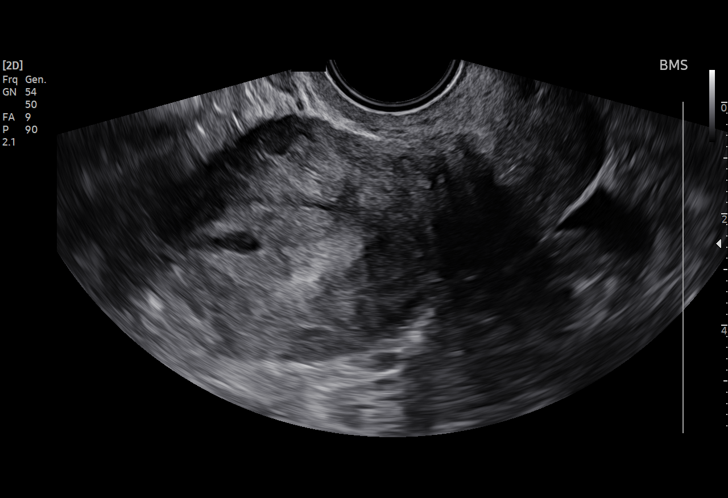
[im 32/79]
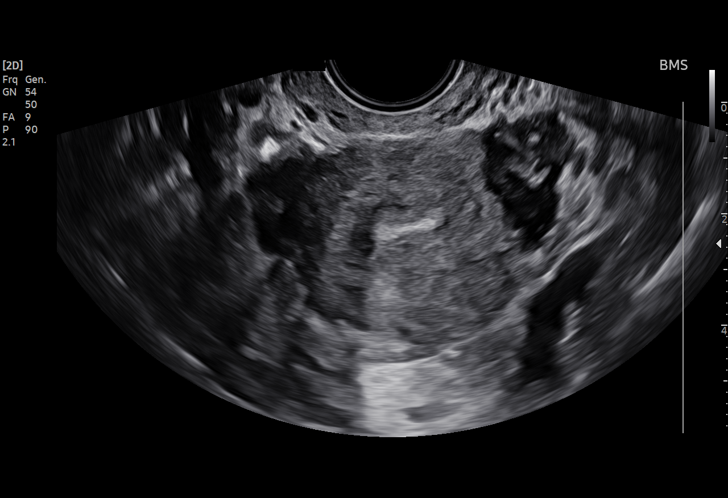
[im 38/79]
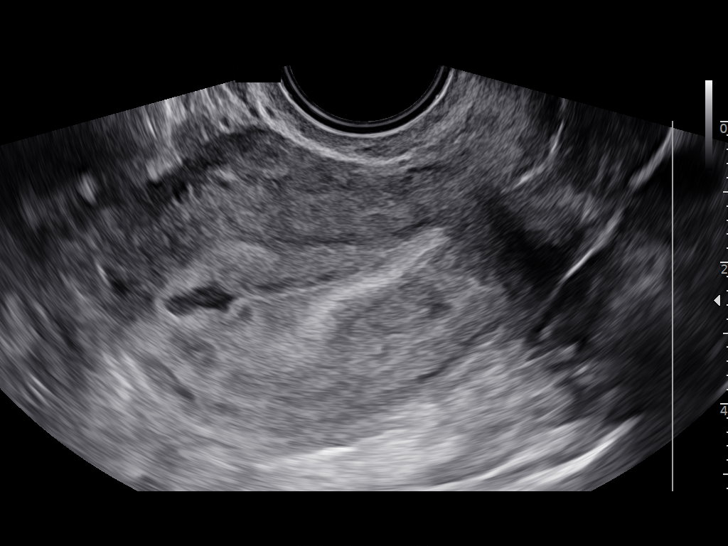
[im 44/79]
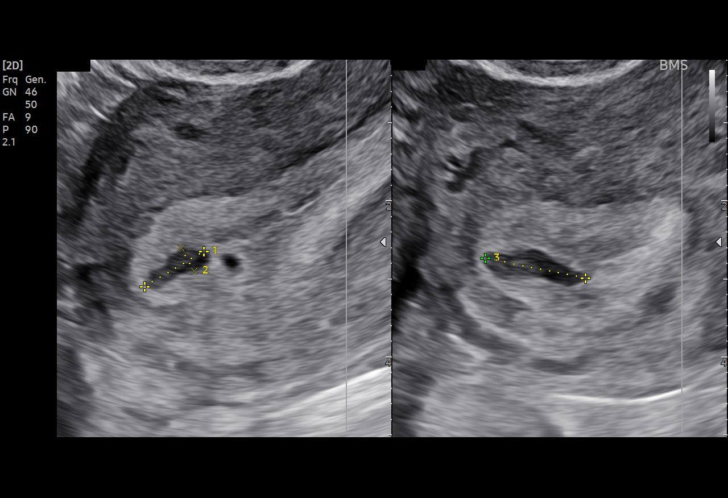
[im 50/79]
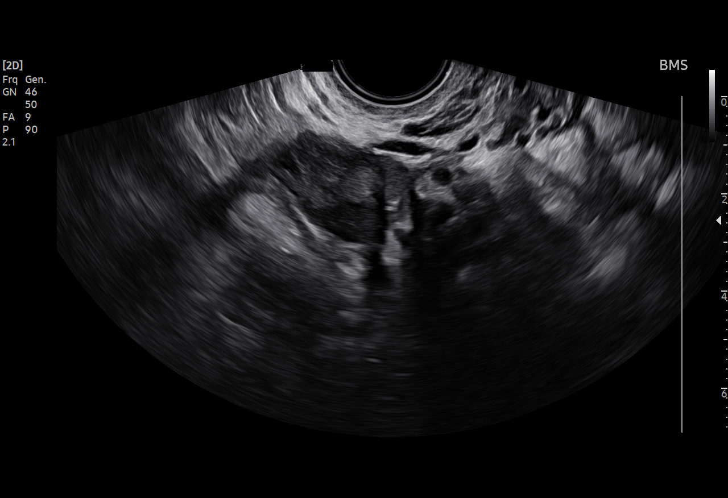
[im 55/79]
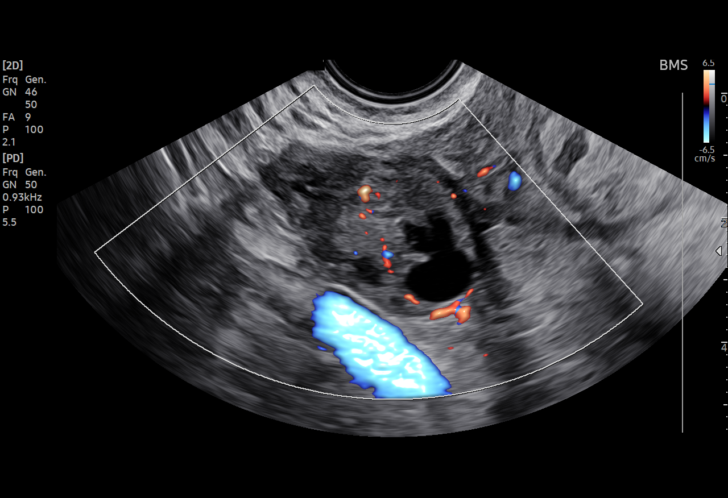
[im 61/79]
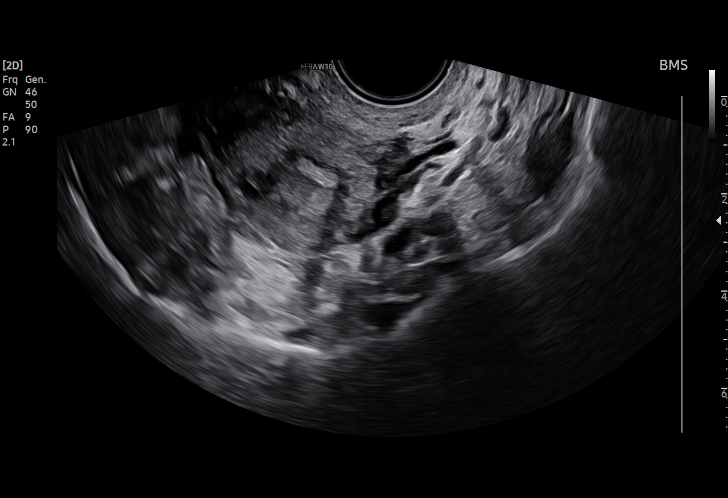
[im 67/79]
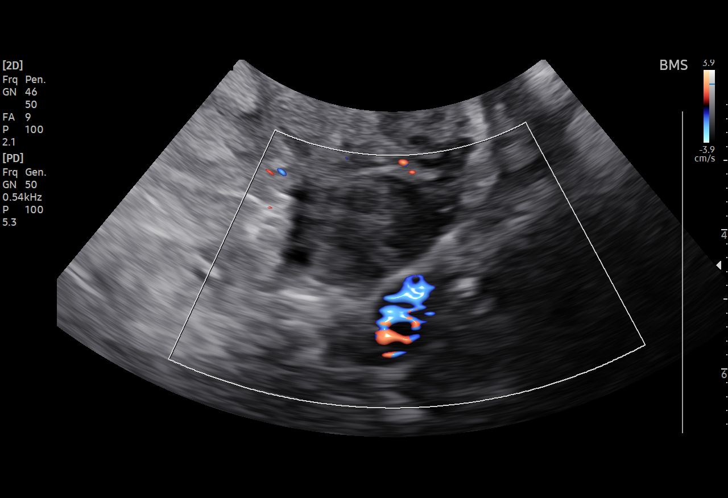
[im 73/79]
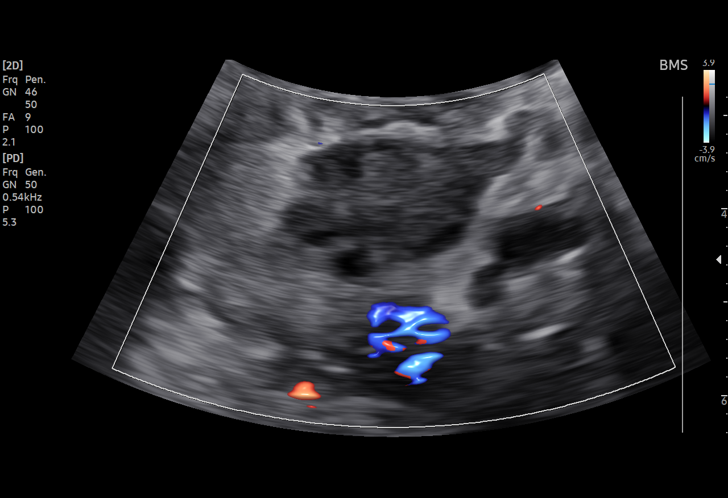
[im 79/79]
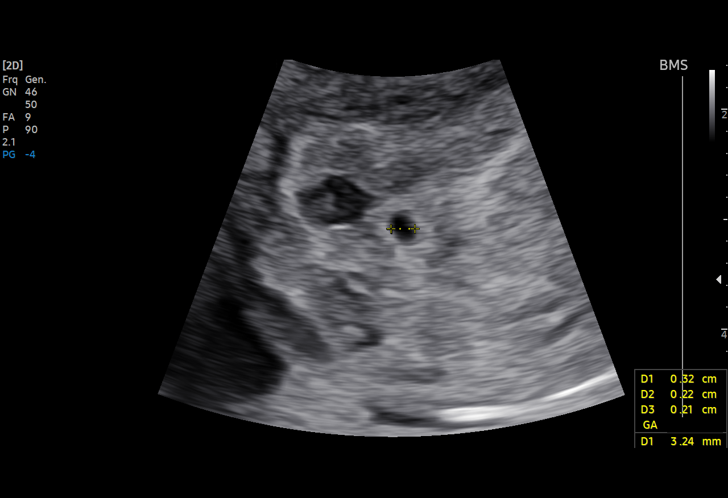

[14 of 28 positions shown; findings below may reference images not displayed]

FINDINGS: Intrauterine gestational sac: Potential intrauterine gestational sac
is quite small and displays no visible yolk sac or fetal pole see
below.

Yolk sac:  Not visualized

Embryo:  Not visualized

MSD: 3.5 mm   5 w   1 d

Subchorionic hemorrhage: Small subchorionic hemorrhage versus is
blood in the endometrial canal near the potential gestation.

Maternal uterus/adnexae: Transabdominal images are markedly limited
by patient body habitus. Endovaginal images with endometrial
thickening and small posteriorly oriented potential gestational sac.

Normal appearance of bilateral ovaries. Limited color doppler flow
is suggested in the LEFT ovary which is favored to be related to
technical factors based on ovarian position.

Trace free fluid in the pelvis.
IMPRESSION: Suspected very early intrauterine gestation though quite small and
without visible yolk sac or fetal pole at this time. Would treat
this as a pregnancy of unknown anatomic location with 7-10 week
follow-up with ultrasound and trending of beta HCG. Ultrasound could
be performed earlier if symptoms dictate. Strictly speaking, while
unlikely, occult ectopic remains a differential consideration close
follow-up is therefore recommended.

Small subchorionic hemorrhage versus blood in the endometrial canal
which is larger than the suspected gestational sac.

Normal grayscale appearance of the LEFT ovary with limited color
Doppler flow visible, suspect this is related to technical factors.
If there are worsening symptoms could consider repeat imaging with
spectral Doppler as warranted.

## 2023-01-26 ENCOUNTER — Ambulatory Visit: Payer: Medicaid Other

## 2023-01-26 DIAGNOSIS — Z3201 Encounter for pregnancy test, result positive: Secondary | ICD-10-CM | POA: Diagnosis not present

## 2023-01-26 LAB — POCT PREGNANCY, URINE: Preg Test, Ur: POSITIVE — AB

## 2023-01-26 NOTE — Patient Instructions (Signed)

## 2023-01-26 NOTE — Progress Notes (Signed)
Pt left urine for walk in UPT resulting positive.  Pt denies VB.  Pt reports intermittent pain on left side.  Pt states that her LMP is 12/22/22 making her EDD 09/28/23, and 5w today.  Medications and allergies reviewed.  List of medications safe to take in pregnancy provided via MyChart.  Pt requests to start OB care here at [facility].  Due to pt reporting left sided pain pt is advised to go to [facility]U for evaluation as she has had an ectopic about a year ago.  Pt agreed and verbalized understanding to all that was discussed.    Addison Naegeli, RN  01/26/23

## 2023-01-29 ENCOUNTER — Other Ambulatory Visit: Payer: Self-pay

## 2023-01-29 ENCOUNTER — Encounter (HOSPITAL_COMMUNITY): Payer: Self-pay | Admitting: *Deleted

## 2023-01-29 ENCOUNTER — Inpatient Hospital Stay (HOSPITAL_COMMUNITY)
Admission: AD | Admit: 2023-01-29 | Discharge: 2023-01-30 | Disposition: A | Discharge status: Home / Self Care | Payer: Medicaid Other | Attending: Obstetrics & Gynecology | Admitting: Obstetrics & Gynecology

## 2023-01-29 ENCOUNTER — Inpatient Hospital Stay (HOSPITAL_COMMUNITY): Payer: Medicaid Other

## 2023-01-29 DIAGNOSIS — O23591 Infection of other part of genital tract in pregnancy, first trimester: Secondary | ICD-10-CM | POA: Insufficient documentation

## 2023-01-29 DIAGNOSIS — Z3A01 Less than 8 weeks gestation of pregnancy: Secondary | ICD-10-CM | POA: Insufficient documentation

## 2023-01-29 DIAGNOSIS — B9689 Other specified bacterial agents as the cause of diseases classified elsewhere: Secondary | ICD-10-CM | POA: Diagnosis not present

## 2023-01-29 DIAGNOSIS — O418X90 Other specified disorders of amniotic fluid and membranes, unspecified trimester, not applicable or unspecified: Secondary | ICD-10-CM | POA: Diagnosis not present

## 2023-01-29 DIAGNOSIS — O468X9 Other antepartum hemorrhage, unspecified trimester: Secondary | ICD-10-CM

## 2023-01-29 DIAGNOSIS — O26891 Other specified pregnancy related conditions, first trimester: Secondary | ICD-10-CM | POA: Insufficient documentation

## 2023-01-29 DIAGNOSIS — O208 Other hemorrhage in early pregnancy: Secondary | ICD-10-CM | POA: Insufficient documentation

## 2023-01-29 DIAGNOSIS — R109 Unspecified abdominal pain: Secondary | ICD-10-CM

## 2023-01-29 DIAGNOSIS — R1030 Lower abdominal pain, unspecified: Secondary | ICD-10-CM | POA: Diagnosis not present

## 2023-01-29 LAB — WET PREP, GENITAL
Sperm: NONE SEEN
Trich, Wet Prep: NONE SEEN
WBC, Wet Prep HPF POC: >=10 — AB (ref <10)
Yeast Wet Prep HPF POC: NONE SEEN

## 2023-01-29 LAB — URINALYSIS, ROUTINE W REFLEX MICROSCOPIC
Bilirubin Urine: NEGATIVE
Glucose, UA: NEGATIVE mg/dL
Hgb urine dipstick: NEGATIVE
Ketones, ur: NEGATIVE mg/dL
Leukocytes,Ua: NEGATIVE
Nitrite: NEGATIVE
Protein, ur: NEGATIVE mg/dL
Specific Gravity, Urine: 1.01 (ref 1.005–1.030)
pH: 6 (ref 5.0–8.0)

## 2023-01-29 LAB — CBC
HCT: 37.8 % (ref 36.0–46.0)
Hemoglobin: 12.4 g/dL (ref 12.0–15.0)
MCH: 27.9 pg (ref 26.0–34.0)
MCHC: 32.8 g/dL (ref 30.0–36.0)
MCV: 85.1 fL (ref 80.0–100.0)
Platelets: 288 10*3/uL (ref 150–400)
RBC: 4.44 MIL/uL (ref 3.87–5.11)
RDW: 13.4 % (ref 11.5–15.5)
WBC: 8.2 10*3/uL (ref 4.0–10.5)
nRBC: 0 % (ref 0.0–0.2)

## 2023-01-29 LAB — HCG, QUANTITATIVE, PREGNANCY: hCG, Beta Chain, Quant, S: 18630 m[IU]/mL — ABNORMAL HIGH (ref <5)

## 2023-01-29 LAB — ABO/RH: ABO/RH(D): O POS

## 2023-01-29 NOTE — MAU Note (Signed)
Teresa Bryant is a 32 y.o. at Unknown here in MAU reporting: she's having abdominal pain that began 1 week ago.  States pain was initially in lower abdomen and on the left side but now the pain is mid abdomen above bellybutton.  States pain in left abdomen was sharp and stabbing, but currently pain in mid abdomen is an intermittent burning and aching.  Denies VB. LMP: 12/22/2022 Onset of complaint: 1 week ago Pain score: 4 Vitals:   01/29/23 1902  BP: 118/70  Pulse: 63  Resp: 18  Temp: 98 F (36.7 C)  SpO2: 100%     FHT:NA Lab orders placed from triage:   UA

## 2023-01-30 DIAGNOSIS — O418X90 Other specified disorders of amniotic fluid and membranes, unspecified trimester, not applicable or unspecified: Secondary | ICD-10-CM

## 2023-01-30 DIAGNOSIS — R109 Unspecified abdominal pain: Secondary | ICD-10-CM

## 2023-01-30 DIAGNOSIS — Z3A01 Less than 8 weeks gestation of pregnancy: Secondary | ICD-10-CM

## 2023-01-30 DIAGNOSIS — O468X9 Other antepartum hemorrhage, unspecified trimester: Secondary | ICD-10-CM

## 2023-01-30 LAB — GC/CHLAMYDIA PROBE AMP (~~LOC~~) NOT AT ARMC
Chlamydia: NEGATIVE
Comment: NEGATIVE
Comment: NORMAL
Neisseria Gonorrhea: NEGATIVE

## 2023-01-30 MED ORDER — METRONIDAZOLE 500 MG PO TABS: 500.0000 mg | ORAL_TABLET | Freq: Two times a day (BID) | ORAL | 0 refills | Status: AC

## 2023-01-30 NOTE — MAU Provider Note (Signed)
History     CSN: [id number]  Arrival date and time: 01/29/23 1843   Event Date/Time   First Provider Initiated Contact with Patient 01/30/23 0004      Chief Complaint  Patient presents with   Abdominal Pain   Teresa Bryant , a  32 y.o. [id number] at [redacted]w[redacted]d presents to MAU with complaints of lower abdominal cramping for the last week. She states that the pain has been intermittent and describes it as "cramping" currently rating it a 4/10. She denies attempting to relieve symptoms and reports that it has "eased off since arrival." She denies vaginal bleeding but noted that she is having a little odor in her vaginal discharge. She has no other complaints.          OB History     Gravida  9   Para  1   Term  1   Preterm      AB  6   Living  1      SAB      IAB  6   Ectopic      Multiple      Live Births  1           Past Medical History:  Diagnosis Date   GERD (gastroesophageal reflux disease)    Headache    Lactose intolerance    Ovarian cyst     Past Surgical History:  Procedure Laterality Date   INDUCED ABORTION      Family History  Problem Relation Age of Onset   Healthy Mother    Healthy Father     Social History   Tobacco Use   Smoking status: Never   Smokeless tobacco: Never  Vaping Use   Vaping status: Never Used  Substance Use Topics   Alcohol use: No   Drug use: No    Allergies:  Allergies  Allergen Reactions   Bee Pollen    Cottonseed Oil    Mixed Grasses    Other     "Animals."  Red nose, rash, runny nose.    Medications Prior to Admission  Medication Sig Dispense Refill Last Dose/Taking   Prenatal Vit-Fe Fumarate-FA (PREPLUS) 27-1 MG TABS Take 1 tablet by mouth daily. 30 tablet 6     Review of Systems  Constitutional:  Negative for chills, fatigue and fever.  Eyes:  Negative for pain and visual disturbance.  Respiratory:  Negative for apnea, shortness of breath and wheezing.   Cardiovascular:  Negative for  chest pain and palpitations.  Gastrointestinal:  Positive for abdominal pain. Negative for constipation, diarrhea, nausea and vomiting.  Genitourinary:  Positive for pelvic pain. Negative for difficulty urinating, dysuria, vaginal bleeding, vaginal discharge and vaginal pain.  Musculoskeletal:  Negative for back pain.  Neurological:  Negative for seizures, weakness and headaches.  Psychiatric/Behavioral:  Negative for suicidal ideas.    Physical Exam   Blood pressure 118/70, pulse 63, temperature 98 F (36.7 C), temperature source Oral, resp. rate 18, height 5\' 1"  (1.549 m), weight 82.6 kg, last menstrual period 12/22/2022, SpO2 100%, unknown if currently breastfeeding.  Physical Exam Vitals and nursing note reviewed.  Constitutional:      General: She is not in acute distress.    Appearance: Normal appearance.  HENT:     Head: Normocephalic.  Pulmonary:     Effort: Pulmonary effort is normal.  Abdominal:     Tenderness: There is no abdominal tenderness.  Musculoskeletal:     Cervical back: Normal range of motion.  Skin:    General: Skin is warm and dry.  Neurological:     Mental Status: She is alert and oriented to [place], place, and time.  Psychiatric:        Mood and Affect: Mood normal.     MAU Course  Procedures Orders Placed This Encounter  Procedures   Wet prep, genital   US OB LESS THAN 14 WEEKS WITH OB TRANSVAGINAL   Urinalysis, Routine w reflex microscopic -Urine, Clean Catch   CBC   hCG, quantitative, pregnancy   Diet NPO time specified   ABO/Rh   Discharge patient   Results for orders placed or performed during the hospital encounter of 01/29/23 (from the past 24 hours)  Urinalysis, Routine w reflex microscopic -Urine, Clean Catch     Status: None   Collection Time: 01/29/23  7:11 PM  Result Value Ref Range   Color, Urine YELLOW YELLOW   APPearance CLEAR CLEAR   Specific Gravity, Urine 1.010 1.005 - 1.030   pH 6.0 5.0 - 8.0   Glucose, UA NEGATIVE  NEGATIVE mg/dL   Hgb urine dipstick NEGATIVE NEGATIVE   Bilirubin Urine NEGATIVE NEGATIVE   Ketones, ur NEGATIVE NEGATIVE mg/dL   Protein, ur NEGATIVE NEGATIVE mg/dL   Nitrite NEGATIVE NEGATIVE   Leukocytes,Ua NEGATIVE NEGATIVE  CBC     Status: None   Collection Time: 01/29/23  9:40 PM  Result Value Ref Range   WBC 8.2 4.0 - 10.5 K/uL   RBC 4.44 3.87 - 5.11 MIL/uL   Hemoglobin 12.4 12.0 - 15.0 g/dL   HCT 16.1 09.6 - 04.5 %   MCV 85.1 80.0 - 100.0 fL   MCH 27.9 26.0 - 34.0 pg   MCHC 32.8 30.0 - 36.0 g/dL   RDW 40.9 81.1 - 91.4 %   Platelets 288 150 - 400 K/uL   nRBC 0.0 0.0 - 0.2 %  ABO/Rh     Status: None   Collection Time: 01/29/23  9:40 PM  Result Value Ref Range   ABO/RH(D) O POS    No rh immune globuloin      NOT A RH IMMUNE GLOBULIN CANDIDATE, PT RH POSITIVE Performed at [facility], [address] [place], [place] [address]   hCG, quantitative, pregnancy     Status: Abnormal   Collection Time: 01/29/23  9:40 PM  Result Value Ref Range   hCG, Beta Chain, Quant, S 18,630 (H) <5 mIU/mL  Wet prep, genital     Status: Abnormal   Collection Time: 01/29/23 10:09 PM   Specimen: PATH Cytology Cervicovaginal Ancillary Only  Result Value Ref Range   Yeast Wet Prep HPF POC NONE SEEN NONE SEEN   Trich, Wet Prep NONE SEEN NONE SEEN   Clue Cells Wet Prep HPF POC PRESENT (A) NONE SEEN   WBC, Wet Prep HPF POC >=10 (A) <10   Sperm NONE SEEN    US OB LESS THAN 14 WEEKS WITH OB TRANSVAGINAL Result Date: 01/30/2023 CLINICAL DATA:  Abdominal pain for 1 week. Beta HCG pending. LMP 12/22/2022 EXAM: OBSTETRIC <14 WK [place] AND TRANSVAGINAL OB US TECHNIQUE: Both transabdominal and transvaginal ultrasound examinations were performed for complete evaluation of the gestation as well as the maternal uterus, adnexal regions, and pelvic cul-de-sac. Transvaginal technique was performed to assess early pregnancy. COMPARISON:  None Available. FINDINGS: Intrauterine gestational sac: Single Yolk  sac:  Visualized. Embryo:  Not Visualized. Cardiac Activity: Not Visualized. MSD: 10.3 mm   5 w 5 d Subchorionic hemorrhage: Small  volume subchorionic hemorrhage measuring 4 x 4 mm. Maternal uterus/adnexae: Nonvisualized left ovary. Unremarkable right ovary. No free fluid. IMPRESSION: Probable early intrauterine gestational sac and yolk sac, but no fetal pole, or cardiac activity yet visualized. Recommend follow-up quantitative B-HCG levels and follow-up US in 14 days to assess viability. This recommendation follows SRU consensus guidelines: Diagnostic Criteria for Nonviable Pregnancy Early in the First Trimester. Malva Limes Med 2013; 578:4696-29. Small subchorionic hemorrhage. Electronically Signed   By: Minerva Fester M.D.   On: 01/30/2023 00:06    MDM - Wet prep positive for clue cells, likely BV plan to treat.  - Hcg [id number] today - UA normal, low suspicion for UTI - Reviewed [place] results and noted a single IUP with GS and YS with [facility].  - Recommended to follow up with OBGYN of her choosing.  - Plan for discharge home.   Assessment and Plan   1. Subchorionic hematoma, antepartum, single or unspecified fetus   2. [redacted] weeks gestation of pregnancy   3. Abdominal pain, unspecified abdominal location   4. BV (bacterial vaginosis)    - Reviewed [facility] and bleeding expectations and worsening signs and symptoms.  - Return precautions provided.  - Discussed vaginal infections can cause abdominal cramping and vaginal irritation.  - Rx for Metronidazole sent to outpatient pharmacy - Patient discharged home in stable condition and may return to MAU as needed.   Claudette Head, MSN CNM  01/30/2023, 12:04 AM

## 2023-01-30 NOTE — Progress Notes (Signed)
Written and verbal d/c instructions given and understanding voiced. 

## 2023-01-30 NOTE — Discharge Instructions (Addendum)
Prenatal Care Providers           [facility]  [address] [phone]  [facility]   [address]  [phone]  [facility] [place]       [address] [phone]            [facility] @ [place]     [address] [phone]          [facility] [place]   [address] [phone]  [facility]  [address] [phone]     [facility])  [address]   [phone]     [facility]  Phone: [phone]  [facility]  Phone: [phone]  [facility] Phone: [phone]  [facility] Phone: [phone]  [facility] Phone: [phone]  [facility] Phone: [phone]  [facility]  Phone: [phone]  

## 2023-02-05 ENCOUNTER — Encounter (HOSPITAL_COMMUNITY): Payer: Self-pay | Admitting: Obstetrics and Gynecology

## 2023-02-05 ENCOUNTER — Inpatient Hospital Stay (HOSPITAL_COMMUNITY)
Admission: AD | Admit: 2023-02-05 | Discharge: 2023-02-05 | Disposition: A | Discharge status: Home / Self Care | Payer: Medicaid Other | Attending: Obstetrics and Gynecology | Admitting: Obstetrics and Gynecology

## 2023-02-05 ENCOUNTER — Telehealth: Payer: Self-pay

## 2023-02-05 DIAGNOSIS — N939 Abnormal uterine and vaginal bleeding, unspecified: Secondary | ICD-10-CM | POA: Diagnosis present

## 2023-02-05 DIAGNOSIS — Z3A01 Less than 8 weeks gestation of pregnancy: Secondary | ICD-10-CM | POA: Diagnosis not present

## 2023-02-05 DIAGNOSIS — R109 Unspecified abdominal pain: Secondary | ICD-10-CM | POA: Diagnosis present

## 2023-02-05 DIAGNOSIS — O3680X0 Pregnancy with inconclusive fetal viability, not applicable or unspecified: Secondary | ICD-10-CM | POA: Diagnosis present

## 2023-02-05 MED ORDER — ONDANSETRON 4 MG PO TBDP
8.0000 mg | ORAL_TABLET | Freq: Once | ORAL | Status: DC
  Filled 2023-02-05: qty 2

## 2023-02-05 MED ORDER — PROMETHAZINE HCL 12.5 MG PO TABS: 12.5000 mg | ORAL_TABLET | Freq: Four times a day (QID) | ORAL | 0 refills | Status: AC | PRN

## 2023-02-05 MED ORDER — ONDANSETRON 8 MG PO TBDP
8.0000 mg | ORAL_TABLET | Freq: Three times a day (TID) | ORAL | 0 refills | Status: AC | PRN
Start: 2023-02-05 — End: ?

## 2023-02-05 NOTE — MAU Provider Note (Signed)
History     CSN: [id number]  Arrival date and time: 02/05/23 1135   Event Date/Time   First Provider Initiated Contact with Patient 02/05/23 1403      Chief Complaint  Patient presents with   Abdominal Pain   Vaginal Bleeding   HPI Ms. Teresa Bryant is a 32 y.o. year old [id number] female at [redacted]w[redacted]d weeks gestation who presents to MAU reporting she was told she needed to come to MAU for further testing because she may have an ectopic pregnancy. She reports while she was out of town she started having VB and sharp pain on Sunday 02/01/2023. She denies any pain or VB today. She does report nausea and would like a Rx for anti-emetic. She was seen in MAU on 01/30/2023 where she had an IUGS measuring 5.5 wks with (+) YS and 4x4 mm [facility]. She was not scheduled for a follow-up U/S 2 weeks after the one in MAU on 01/30/2023. She states, "This happened to me the last time I was pregnant and they set me up for a viability U/S. I ended up having an ectopic pregnancy. I don't want that to happen again this time."  OB History     Gravida  9   Para  1   Term  1   Preterm      AB  6   Living  1      SAB      IAB  6   Ectopic      Multiple      Live Births  1           Past Medical History:  Diagnosis Date   GERD (gastroesophageal reflux disease)    Headache    Lactose intolerance    Ovarian cyst     Past Surgical History:  Procedure Laterality Date   INDUCED ABORTION      Family History  Problem Relation Age of Onset   Healthy Mother    Healthy Father     Social History   Tobacco Use   Smoking status: Never   Smokeless tobacco: Never  Vaping Use   Vaping status: Never Used  Substance Use Topics   Alcohol use: No   Drug use: No    Allergies:  Allergies  Allergen Reactions   Bee Pollen    Cottonseed Oil    Mixed Grasses    Other     "Animals."  Red nose, rash, runny nose.    Medications Prior to Admission  Medication Sig Dispense Refill Last  Dose/Taking   metroNIDAZOLE (FLAGYL) 500 MG tablet Take 1 tablet (500 mg total) by mouth 2 (two) times daily. 14 tablet 0    Prenatal Vit-Fe Fumarate-FA (PREPLUS) 27-1 MG TABS Take 1 tablet by mouth daily. 30 tablet 6     Review of Systems  Constitutional: Negative.   HENT: Negative.    Eyes: Negative.   Respiratory: Negative.    Cardiovascular: Negative.   Gastrointestinal: Negative.   Endocrine: Negative.   Genitourinary: Negative.   Musculoskeletal: Negative.   Hematological: Negative.   Psychiatric/Behavioral: Negative.     Physical Exam   Blood pressure 118/72, pulse 73, temperature 98.3 F (36.8 C), resp. rate 18, last menstrual period 12/22/2022, unknown if currently breastfeeding.  Physical Exam  MAU Course  Procedures  MDM Explained that although her pregnancy is not confirmed to be viable, there is no indication to repeat an U/S at this time. Advised that if that will reassure her mind,  an U/S could be ordered -- patient declined and accepted that a F/U U/S could be scheduled instead Offered Zofran 8 mg ODT here -- patient declined stating she would prefer a Rx for at home  Assessment and Plan  1. Pregnancy with uncertain fetal viability, single or unspecified fetus (Primary) - F/U viability U/S scheduled for 02/16/2023 @ 1415  2. [redacted] weeks gestation of pregnancy   - Discharge patient - Keep scheduled viability U/S on 1/6 - Patient verbalized an understanding of the plan of care and agrees.    Raelyn Mora, CNM 02/05/2023, 2:18 PM

## 2023-02-05 NOTE — Discharge Instructions (Signed)
You have a diagnosis of pregnancy of uncertain viability. This puts you at higher risk for miscarriage. You have not been diagnosed with an ectopic pregnancy at this time.

## 2023-02-05 NOTE — MAU Note (Signed)
.  Teresa Bryant is a 32 y.o. at [redacted]w[redacted]d here in MAU reporting: pt was called and told she may have an ectopic. Sent for further testing? Pt reports she has had some bleeding and sharp pain on Sunday but denies any today. Is reporting nausea.   LMP:  Onset of complaint: Sunday Pain score: 0 Vitals:   02/05/23 1335  BP: 118/72  Pulse: 73  Resp: 18  Temp: 98.3 F (36.8 C)     FHT: Lab orders placed from triage: 118/72

## 2023-02-06 NOTE — Telephone Encounter (Signed)
Pt call transferred from the front office.  Pt informs me that she is currently sitting in MAU for someone from our office told her to come for ectopic evaluation and just wants something nausea.  I explained to the pt that she was advised by our on call nurse to go to MAU.  Pt stated that she just wants some nausea.  I informed pt that she is in the best place and that she would get her nausea prescription.  Pt verbalized understanding with no further questions.   Leonette Nutting  02/10/23

## 2023-02-13 ENCOUNTER — Other Ambulatory Visit: Payer: Self-pay | Admitting: *Deleted

## 2023-02-13 DIAGNOSIS — O3680X0 Pregnancy with inconclusive fetal viability, not applicable or unspecified: Secondary | ICD-10-CM

## 2023-02-16 ENCOUNTER — Other Ambulatory Visit: Payer: Self-pay

## 2023-02-16 ENCOUNTER — Ambulatory Visit (INDEPENDENT_AMBULATORY_CARE_PROVIDER_SITE_OTHER): Payer: 59

## 2023-02-16 DIAGNOSIS — Z3491 Encounter for supervision of normal pregnancy, unspecified, first trimester: Secondary | ICD-10-CM

## 2023-02-16 DIAGNOSIS — Z3A01 Less than 8 weeks gestation of pregnancy: Secondary | ICD-10-CM

## 2023-02-16 DIAGNOSIS — O3680X0 Pregnancy with inconclusive fetal viability, not applicable or unspecified: Secondary | ICD-10-CM

## 2023-02-18 ENCOUNTER — Telehealth: Payer: Self-pay | Admitting: Family Medicine

## 2023-02-18 NOTE — Telephone Encounter (Signed)
 Pt called wanting to speak to a nurse. She says she is sick and was seen in [place] 12/26 and was told that she is experiencing pregnancy symptoms. This morning she woke up still vomiting clear and she still has a runny nose and cough so she believes she may have some kind of virus. Transferred her to nurse.

## 2023-02-18 NOTE — Telephone Encounter (Signed)
 Pt call transferred from the front office.

## 2023-03-02 ENCOUNTER — Telehealth: Payer: Self-pay | Admitting: Family Medicine

## 2023-03-02 ENCOUNTER — Telehealth: Payer: Medicaid Other

## 2023-03-02 NOTE — Telephone Encounter (Signed)
Patient no showed intake appointment today. Called patient to get rescheduled. Did reschedule new ob intake for 03/11/23 at 3:15 pm left message for patient to call back to schedule first new ob appointment.

## 2023-03-02 NOTE — Progress Notes (Signed)
Called pt x 2 and text link sent to join MyChart visit. Will be rescheduled by front office staff.

## 2023-03-11 ENCOUNTER — Telehealth: Payer: Medicaid Other

## 2023-03-11 ENCOUNTER — Encounter: Payer: Medicaid Other | Admitting: Obstetrics & Gynecology
